# Patient Record
Sex: Male | Born: 1945
Health system: Southern US, Community
[De-identification: ages and names within clinical notes are randomized; demographics above are authoritative.]

## PROBLEM LIST (undated history)

## (undated) DIAGNOSIS — Z86718 Personal history of other venous thrombosis and embolism: Secondary | ICD-10-CM

## (undated) DIAGNOSIS — F329 Major depressive disorder, single episode, unspecified: Secondary | ICD-10-CM

## (undated) DIAGNOSIS — F32A Depression, unspecified: Secondary | ICD-10-CM

## (undated) HISTORY — PX: HERNIA REPAIR: SHX51

---
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
## 2015-05-10 DIAGNOSIS — F418 Other specified anxiety disorders: Secondary | ICD-10-CM | POA: Diagnosis not present

## 2015-05-10 DIAGNOSIS — I10 Essential (primary) hypertension: Secondary | ICD-10-CM | POA: Diagnosis not present

## 2015-05-23 DIAGNOSIS — I10 Essential (primary) hypertension: Secondary | ICD-10-CM | POA: Diagnosis not present

## 2015-06-07 DIAGNOSIS — I10 Essential (primary) hypertension: Secondary | ICD-10-CM | POA: Diagnosis not present

## 2015-06-07 DIAGNOSIS — R05 Cough: Secondary | ICD-10-CM | POA: Diagnosis not present

## 2015-07-18 DIAGNOSIS — H401131 Primary open-angle glaucoma, bilateral, mild stage: Secondary | ICD-10-CM | POA: Diagnosis not present

## 2015-07-18 DIAGNOSIS — H43813 Vitreous degeneration, bilateral: Secondary | ICD-10-CM | POA: Diagnosis not present

## 2016-04-04 DIAGNOSIS — H401131 Primary open-angle glaucoma, bilateral, mild stage: Secondary | ICD-10-CM | POA: Diagnosis not present

## 2016-05-21 DIAGNOSIS — I10 Essential (primary) hypertension: Secondary | ICD-10-CM | POA: Diagnosis not present

## 2016-05-21 DIAGNOSIS — R7301 Impaired fasting glucose: Secondary | ICD-10-CM | POA: Diagnosis not present

## 2016-05-21 DIAGNOSIS — E7800 Pure hypercholesterolemia, unspecified: Secondary | ICD-10-CM | POA: Diagnosis not present

## 2016-05-21 DIAGNOSIS — Z23 Encounter for immunization: Secondary | ICD-10-CM | POA: Diagnosis not present

## 2016-05-21 DIAGNOSIS — Z0000 Encounter for general adult medical examination without abnormal findings: Secondary | ICD-10-CM | POA: Diagnosis not present

## 2016-06-18 DIAGNOSIS — I10 Essential (primary) hypertension: Secondary | ICD-10-CM | POA: Diagnosis not present

## 2016-06-18 DIAGNOSIS — L918 Other hypertrophic disorders of the skin: Secondary | ICD-10-CM | POA: Diagnosis not present

## 2016-10-03 DIAGNOSIS — H35372 Puckering of macula, left eye: Secondary | ICD-10-CM | POA: Diagnosis not present

## 2016-10-03 DIAGNOSIS — H401131 Primary open-angle glaucoma, bilateral, mild stage: Secondary | ICD-10-CM | POA: Diagnosis not present

## 2016-10-07 DIAGNOSIS — W57XXXA Bitten or stung by nonvenomous insect and other nonvenomous arthropods, initial encounter: Secondary | ICD-10-CM | POA: Diagnosis not present

## 2016-10-07 DIAGNOSIS — S30861A Insect bite (nonvenomous) of abdominal wall, initial encounter: Secondary | ICD-10-CM | POA: Diagnosis not present

## 2016-11-18 DIAGNOSIS — I10 Essential (primary) hypertension: Secondary | ICD-10-CM | POA: Diagnosis not present

## 2016-11-19 DIAGNOSIS — E7800 Pure hypercholesterolemia, unspecified: Secondary | ICD-10-CM | POA: Diagnosis not present

## 2016-11-19 DIAGNOSIS — R7301 Impaired fasting glucose: Secondary | ICD-10-CM | POA: Diagnosis not present

## 2016-11-19 DIAGNOSIS — I10 Essential (primary) hypertension: Secondary | ICD-10-CM | POA: Diagnosis not present

## 2017-02-04 DIAGNOSIS — H401131 Primary open-angle glaucoma, bilateral, mild stage: Secondary | ICD-10-CM | POA: Diagnosis not present

## 2017-02-04 DIAGNOSIS — H35372 Puckering of macula, left eye: Secondary | ICD-10-CM | POA: Diagnosis not present

## 2017-03-11 DIAGNOSIS — H401131 Primary open-angle glaucoma, bilateral, mild stage: Secondary | ICD-10-CM | POA: Diagnosis not present

## 2017-03-27 DIAGNOSIS — S5011XA Contusion of right forearm, initial encounter: Secondary | ICD-10-CM | POA: Diagnosis not present

## 2017-04-29 DIAGNOSIS — F329 Major depressive disorder, single episode, unspecified: Secondary | ICD-10-CM | POA: Diagnosis not present

## 2017-04-30 DIAGNOSIS — F411 Generalized anxiety disorder: Secondary | ICD-10-CM | POA: Diagnosis not present

## 2017-04-30 DIAGNOSIS — F430 Acute stress reaction: Secondary | ICD-10-CM | POA: Diagnosis not present

## 2017-05-07 DIAGNOSIS — R413 Other amnesia: Secondary | ICD-10-CM | POA: Diagnosis not present

## 2017-05-09 DIAGNOSIS — G4700 Insomnia, unspecified: Secondary | ICD-10-CM | POA: Diagnosis not present

## 2017-05-09 DIAGNOSIS — F4321 Adjustment disorder with depressed mood: Secondary | ICD-10-CM | POA: Diagnosis not present

## 2017-05-12 DIAGNOSIS — Z9181 History of falling: Secondary | ICD-10-CM | POA: Diagnosis not present

## 2017-05-12 DIAGNOSIS — R4182 Altered mental status, unspecified: Secondary | ICD-10-CM | POA: Diagnosis not present

## 2017-05-12 DIAGNOSIS — N400 Enlarged prostate without lower urinary tract symptoms: Secondary | ICD-10-CM | POA: Diagnosis not present

## 2017-05-12 DIAGNOSIS — D181 Lymphangioma, any site: Secondary | ICD-10-CM | POA: Diagnosis not present

## 2017-05-12 DIAGNOSIS — Z7982 Long term (current) use of aspirin: Secondary | ICD-10-CM | POA: Diagnosis not present

## 2017-05-12 DIAGNOSIS — I10 Essential (primary) hypertension: Secondary | ICD-10-CM | POA: Diagnosis not present

## 2017-05-12 DIAGNOSIS — N390 Urinary tract infection, site not specified: Secondary | ICD-10-CM | POA: Diagnosis not present

## 2017-05-12 DIAGNOSIS — H409 Unspecified glaucoma: Secondary | ICD-10-CM | POA: Diagnosis not present

## 2017-05-12 DIAGNOSIS — F22 Delusional disorders: Secondary | ICD-10-CM | POA: Diagnosis not present

## 2017-05-12 DIAGNOSIS — G960 Cerebrospinal fluid leak: Secondary | ICD-10-CM | POA: Diagnosis not present

## 2017-05-12 DIAGNOSIS — Z79899 Other long term (current) drug therapy: Secondary | ICD-10-CM | POA: Diagnosis not present

## 2017-05-12 DIAGNOSIS — E785 Hyperlipidemia, unspecified: Secondary | ICD-10-CM | POA: Diagnosis not present

## 2017-05-13 DIAGNOSIS — Z9181 History of falling: Secondary | ICD-10-CM | POA: Diagnosis not present

## 2017-05-13 DIAGNOSIS — F22 Delusional disorders: Secondary | ICD-10-CM | POA: Diagnosis not present

## 2017-05-13 DIAGNOSIS — I10 Essential (primary) hypertension: Secondary | ICD-10-CM | POA: Diagnosis not present

## 2017-05-13 DIAGNOSIS — E785 Hyperlipidemia, unspecified: Secondary | ICD-10-CM | POA: Diagnosis not present

## 2017-05-13 DIAGNOSIS — R4182 Altered mental status, unspecified: Secondary | ICD-10-CM | POA: Diagnosis not present

## 2017-05-13 DIAGNOSIS — N390 Urinary tract infection, site not specified: Secondary | ICD-10-CM | POA: Diagnosis not present

## 2017-05-13 DIAGNOSIS — R410 Disorientation, unspecified: Secondary | ICD-10-CM | POA: Diagnosis not present

## 2017-05-13 DIAGNOSIS — G960 Cerebrospinal fluid leak: Secondary | ICD-10-CM | POA: Diagnosis not present

## 2017-05-13 DIAGNOSIS — N400 Enlarged prostate without lower urinary tract symptoms: Secondary | ICD-10-CM | POA: Diagnosis not present

## 2017-05-13 DIAGNOSIS — H409 Unspecified glaucoma: Secondary | ICD-10-CM | POA: Diagnosis not present

## 2017-05-14 ENCOUNTER — Other Ambulatory Visit: Payer: Self-pay

## 2017-05-14 DIAGNOSIS — N401 Enlarged prostate with lower urinary tract symptoms: Secondary | ICD-10-CM | POA: Diagnosis not present

## 2017-05-14 DIAGNOSIS — R351 Nocturia: Secondary | ICD-10-CM | POA: Diagnosis not present

## 2017-05-14 DIAGNOSIS — R339 Retention of urine, unspecified: Secondary | ICD-10-CM | POA: Diagnosis not present

## 2017-05-14 NOTE — Patient Outreach (Signed)
[facility]) Care Management  05/14/2017  Jon Walls 27-Aug-1945 [id number]   Telephone call to patient for transition of care follow up.  No answer.  HIPAA compliant voice message left.  Plan: RN CM will attempt patient again within 3 business days.  [name], RN, MSN [facility] Care Management Care Management Coordinator Direct Line [phone] Toll Free: [phone]  Fax: [phone]

## 2017-05-15 ENCOUNTER — Other Ambulatory Visit: Payer: Self-pay

## 2017-05-15 NOTE — Patient Outreach (Signed)
[facility]) Care Management  05/15/2017  Jon Walls [birth year] [id number]   Referral Date: 05/14/17 Referral Source: HTA report Date of Admission: 05/12/17 Diagnosis: Confusion Date of Discharge: 05/13/17 Facility: Learned: HTA  Outreach attempt # 2 Telephone call to patient.  Patient gives permission to speak to wife. HIPAA verified.  Wife reports that patient has had some confusion and they were told by the doctor to go to the emergency room.  Patient was found to have a subdural hematoma.  She states that they were told that it would hopefully dissolve on its own. She states patient had a slip on some ice about two months ago and that is where the hematoma came from.  She is hoping that patient gets better. She reports she sees some improvement but wanting total improvement.  Patient had some problems with urinary retention as well and saw the urologist on yesterday and was given Flomax and patient has had better urine stream.    Social: Patient loves in the home with his wife who is his primary support person.  She also has support from their son, patient's sister, and church friends.   Conditions: Patient diagnosed with subdural hematoma from a previous fall.  Patient also has some problems with enlarged prostate.    Medications: Wife reviewed medications and offers no concerns.    Appointments:  Patient will be following up with neurology in about 2 weeks.  Patient has transportation to appointments.    Consent: RN CM reviewed [facility] services with wife. Wife declined services as she has enough support right now.  Advised wife that CM would send letter and brochure for future reference .  She verbalized understanding and asked if she had question could she call.  Advised her she could call and if she felt services were needed she could also call.  She verbalized understanding.   Plan: RN CM will send letter and brochure for future reference.  RN  CM provided CM name and number along with 24 hour nurse line number. RN CM will close case and notify care management assistant of case status.    [name], RN, MSN [facility] Care Management Care Management Coordinator Direct Line [phone] Toll Free: [phone]  Fax: [phone]

## 2017-05-20 DIAGNOSIS — N401 Enlarged prostate with lower urinary tract symptoms: Secondary | ICD-10-CM | POA: Diagnosis not present

## 2017-05-20 DIAGNOSIS — N390 Urinary tract infection, site not specified: Secondary | ICD-10-CM | POA: Diagnosis not present

## 2017-05-20 DIAGNOSIS — R413 Other amnesia: Secondary | ICD-10-CM | POA: Diagnosis not present

## 2017-05-20 DIAGNOSIS — Z8679 Personal history of other diseases of the circulatory system: Secondary | ICD-10-CM | POA: Diagnosis not present

## 2017-05-23 ENCOUNTER — Other Ambulatory Visit: Payer: Self-pay | Admitting: Neurological Surgery

## 2017-05-23 DIAGNOSIS — G960 Cerebrospinal fluid leak: Principal | ICD-10-CM

## 2017-05-23 DIAGNOSIS — G9608 Other cranial cerebrospinal fluid leak: Secondary | ICD-10-CM

## 2017-05-26 ENCOUNTER — Ambulatory Visit
Admission: RE | Admit: 2017-05-26 | Discharge: 2017-05-26 | Disposition: A | Discharge status: Home / Self Care | Payer: PPO | Source: Ambulatory Visit | Attending: Neurological Surgery | Admitting: Neurological Surgery

## 2017-05-26 DIAGNOSIS — G9608 Other cranial cerebrospinal fluid leak: Secondary | ICD-10-CM

## 2017-05-26 DIAGNOSIS — R42 Dizziness and giddiness: Secondary | ICD-10-CM | POA: Diagnosis not present

## 2017-05-26 DIAGNOSIS — G960 Cerebrospinal fluid leak: Principal | ICD-10-CM

## 2017-05-28 DIAGNOSIS — G960 Cerebrospinal fluid leak: Secondary | ICD-10-CM | POA: Diagnosis not present

## 2017-05-28 DIAGNOSIS — F4321 Adjustment disorder with depressed mood: Secondary | ICD-10-CM | POA: Diagnosis not present

## 2017-06-11 DIAGNOSIS — G960 Cerebrospinal fluid leak: Secondary | ICD-10-CM | POA: Diagnosis not present

## 2017-06-12 DIAGNOSIS — S065X9A Traumatic subdural hemorrhage with loss of consciousness of unspecified duration, initial encounter: Secondary | ICD-10-CM | POA: Diagnosis not present

## 2017-06-12 DIAGNOSIS — I6200 Nontraumatic subdural hemorrhage, unspecified: Secondary | ICD-10-CM | POA: Diagnosis not present

## 2017-06-14 DIAGNOSIS — G939 Disorder of brain, unspecified: Secondary | ICD-10-CM | POA: Diagnosis not present

## 2017-06-14 DIAGNOSIS — S065X0A Traumatic subdural hemorrhage without loss of consciousness, initial encounter: Secondary | ICD-10-CM | POA: Diagnosis not present

## 2017-06-14 DIAGNOSIS — R930 Abnormal findings on diagnostic imaging of skull and head, not elsewhere classified: Secondary | ICD-10-CM | POA: Diagnosis not present

## 2017-06-14 DIAGNOSIS — S065X9S Traumatic subdural hemorrhage with loss of consciousness of unspecified duration, sequela: Secondary | ICD-10-CM | POA: Diagnosis not present

## 2017-06-14 DIAGNOSIS — I10 Essential (primary) hypertension: Secondary | ICD-10-CM | POA: Diagnosis not present

## 2017-06-14 DIAGNOSIS — E7800 Pure hypercholesterolemia, unspecified: Secondary | ICD-10-CM | POA: Diagnosis not present

## 2017-06-14 DIAGNOSIS — E7220 Disorder of urea cycle metabolism, unspecified: Secondary | ICD-10-CM | POA: Diagnosis not present

## 2017-06-14 DIAGNOSIS — D181 Lymphangioma, any site: Secondary | ICD-10-CM | POA: Diagnosis not present

## 2017-06-14 DIAGNOSIS — I6200 Nontraumatic subdural hemorrhage, unspecified: Secondary | ICD-10-CM | POA: Diagnosis not present

## 2017-06-14 DIAGNOSIS — G960 Cerebrospinal fluid leak: Secondary | ICD-10-CM | POA: Diagnosis not present

## 2017-06-14 DIAGNOSIS — G935 Compression of brain: Secondary | ICD-10-CM | POA: Diagnosis not present

## 2017-06-14 DIAGNOSIS — R4020 Unspecified coma: Secondary | ICD-10-CM | POA: Diagnosis not present

## 2017-06-14 DIAGNOSIS — R410 Disorientation, unspecified: Secondary | ICD-10-CM | POA: Diagnosis not present

## 2017-06-14 DIAGNOSIS — R42 Dizziness and giddiness: Secondary | ICD-10-CM | POA: Diagnosis not present

## 2017-06-14 DIAGNOSIS — F4489 Other dissociative and conversion disorders: Secondary | ICD-10-CM | POA: Diagnosis not present

## 2017-06-14 DIAGNOSIS — F0150 Vascular dementia without behavioral disturbance: Secondary | ICD-10-CM | POA: Diagnosis not present

## 2017-06-14 DIAGNOSIS — Z9181 History of falling: Secondary | ICD-10-CM | POA: Diagnosis not present

## 2017-06-14 DIAGNOSIS — S0990XA Unspecified injury of head, initial encounter: Secondary | ICD-10-CM | POA: Diagnosis not present

## 2017-06-14 DIAGNOSIS — I609 Nontraumatic subarachnoid hemorrhage, unspecified: Secondary | ICD-10-CM | POA: Diagnosis not present

## 2017-06-14 DIAGNOSIS — F064 Anxiety disorder due to known physiological condition: Secondary | ICD-10-CM | POA: Diagnosis not present

## 2017-06-14 DIAGNOSIS — N400 Enlarged prostate without lower urinary tract symptoms: Secondary | ICD-10-CM | POA: Diagnosis not present

## 2017-06-14 DIAGNOSIS — R413 Other amnesia: Secondary | ICD-10-CM | POA: Diagnosis not present

## 2017-06-14 DIAGNOSIS — F329 Major depressive disorder, single episode, unspecified: Secondary | ICD-10-CM | POA: Diagnosis not present

## 2017-06-14 DIAGNOSIS — F418 Other specified anxiety disorders: Secondary | ICD-10-CM | POA: Diagnosis not present

## 2017-06-14 DIAGNOSIS — E724 Disorders of ornithine metabolism: Secondary | ICD-10-CM | POA: Diagnosis not present

## 2017-06-14 DIAGNOSIS — F419 Anxiety disorder, unspecified: Secondary | ICD-10-CM | POA: Diagnosis not present

## 2017-06-14 DIAGNOSIS — R451 Restlessness and agitation: Secondary | ICD-10-CM | POA: Diagnosis not present

## 2017-06-14 DIAGNOSIS — Z7409 Other reduced mobility: Secondary | ICD-10-CM | POA: Diagnosis not present

## 2017-06-14 DIAGNOSIS — R4182 Altered mental status, unspecified: Secondary | ICD-10-CM | POA: Diagnosis not present

## 2017-06-14 DIAGNOSIS — F0631 Mood disorder due to known physiological condition with depressive features: Secondary | ICD-10-CM | POA: Diagnosis not present

## 2017-06-15 DIAGNOSIS — N400 Enlarged prostate without lower urinary tract symptoms: Secondary | ICD-10-CM | POA: Diagnosis not present

## 2017-06-15 DIAGNOSIS — R451 Restlessness and agitation: Secondary | ICD-10-CM | POA: Diagnosis not present

## 2017-06-15 DIAGNOSIS — R42 Dizziness and giddiness: Secondary | ICD-10-CM | POA: Diagnosis not present

## 2017-06-15 DIAGNOSIS — I6200 Nontraumatic subdural hemorrhage, unspecified: Secondary | ICD-10-CM | POA: Diagnosis not present

## 2017-06-15 DIAGNOSIS — E7220 Disorder of urea cycle metabolism, unspecified: Secondary | ICD-10-CM | POA: Diagnosis not present

## 2017-06-16 ENCOUNTER — Other Ambulatory Visit: Payer: Self-pay

## 2017-06-16 NOTE — Patient Outreach (Signed)
[facility]) Care Management  06/16/2017  Jon Walls [birth year] [id number]   Telephone call to patient for follow up nurse line call. Spoke with wife Jon Walls.  She is able to verify HIPAA.  She states that patient is currently at [facility] due to confusion.  She reports that patient ammonia levels are up. She states patient is on a locked unit due to confusion and trying leave the hospital.  Wife thankful for follow up call.  No concerns at this time.    Plan RN CM will close case and notify care management assistant of case status.   [name], RN, MSN [facility] Care Management Care Management Coordinator Direct Line [phone] Toll Free: [phone]  Fax: [phone]

## 2017-06-23 DIAGNOSIS — F418 Other specified anxiety disorders: Secondary | ICD-10-CM | POA: Diagnosis not present

## 2017-06-23 DIAGNOSIS — Z7902 Long term (current) use of antithrombotics/antiplatelets: Secondary | ICD-10-CM | POA: Diagnosis not present

## 2017-06-23 DIAGNOSIS — R001 Bradycardia, unspecified: Secondary | ICD-10-CM | POA: Diagnosis not present

## 2017-06-23 DIAGNOSIS — K5900 Constipation, unspecified: Secondary | ICD-10-CM | POA: Diagnosis not present

## 2017-06-23 DIAGNOSIS — N3001 Acute cystitis with hematuria: Secondary | ICD-10-CM | POA: Diagnosis not present

## 2017-06-23 DIAGNOSIS — Z9181 History of falling: Secondary | ICD-10-CM | POA: Diagnosis not present

## 2017-06-23 DIAGNOSIS — R441 Visual hallucinations: Secondary | ICD-10-CM | POA: Diagnosis not present

## 2017-06-23 DIAGNOSIS — D181 Lymphangioma, any site: Secondary | ICD-10-CM | POA: Diagnosis not present

## 2017-06-23 DIAGNOSIS — E7220 Disorder of urea cycle metabolism, unspecified: Secondary | ICD-10-CM | POA: Diagnosis not present

## 2017-06-23 DIAGNOSIS — Z7982 Long term (current) use of aspirin: Secondary | ICD-10-CM | POA: Diagnosis not present

## 2017-06-23 DIAGNOSIS — F419 Anxiety disorder, unspecified: Secondary | ICD-10-CM | POA: Diagnosis not present

## 2017-06-23 DIAGNOSIS — E7200 Disorders of amino-acid transport, unspecified: Secondary | ICD-10-CM | POA: Diagnosis not present

## 2017-06-23 DIAGNOSIS — B961 Klebsiella pneumoniae [K. pneumoniae] as the cause of diseases classified elsewhere: Secondary | ICD-10-CM | POA: Diagnosis not present

## 2017-06-23 DIAGNOSIS — B9620 Unspecified Escherichia coli [E. coli] as the cause of diseases classified elsewhere: Secondary | ICD-10-CM | POA: Diagnosis not present

## 2017-06-23 DIAGNOSIS — Z6822 Body mass index (BMI) 22.0-22.9, adult: Secondary | ICD-10-CM | POA: Diagnosis not present

## 2017-06-23 DIAGNOSIS — R413 Other amnesia: Secondary | ICD-10-CM | POA: Diagnosis not present

## 2017-06-23 DIAGNOSIS — I10 Essential (primary) hypertension: Secondary | ICD-10-CM | POA: Diagnosis not present

## 2017-06-23 DIAGNOSIS — E43 Unspecified severe protein-calorie malnutrition: Secondary | ICD-10-CM | POA: Diagnosis not present

## 2017-06-23 DIAGNOSIS — R630 Anorexia: Secondary | ICD-10-CM | POA: Diagnosis not present

## 2017-06-23 DIAGNOSIS — G960 Cerebrospinal fluid leak: Secondary | ICD-10-CM | POA: Diagnosis not present

## 2017-06-23 DIAGNOSIS — L89151 Pressure ulcer of sacral region, stage 1: Secondary | ICD-10-CM | POA: Diagnosis not present

## 2017-06-23 DIAGNOSIS — F0281 Dementia in other diseases classified elsewhere with behavioral disturbance: Secondary | ICD-10-CM | POA: Diagnosis not present

## 2017-06-23 DIAGNOSIS — F0151 Vascular dementia with behavioral disturbance: Secondary | ICD-10-CM | POA: Diagnosis not present

## 2017-06-23 DIAGNOSIS — S065X0D Traumatic subdural hemorrhage without loss of consciousness, subsequent encounter: Secondary | ICD-10-CM | POA: Diagnosis not present

## 2017-06-23 DIAGNOSIS — Z781 Physical restraint status: Secondary | ICD-10-CM | POA: Diagnosis not present

## 2017-06-23 DIAGNOSIS — E785 Hyperlipidemia, unspecified: Secondary | ICD-10-CM | POA: Diagnosis not present

## 2017-06-23 DIAGNOSIS — F329 Major depressive disorder, single episode, unspecified: Secondary | ICD-10-CM | POA: Diagnosis not present

## 2017-06-23 DIAGNOSIS — S065X9A Traumatic subdural hemorrhage with loss of consciousness of unspecified duration, initial encounter: Secondary | ICD-10-CM | POA: Diagnosis not present

## 2017-06-23 DIAGNOSIS — N400 Enlarged prostate without lower urinary tract symptoms: Secondary | ICD-10-CM | POA: Diagnosis not present

## 2017-06-23 DIAGNOSIS — Z1629 Resistance to other single specified antibiotic: Secondary | ICD-10-CM | POA: Diagnosis not present

## 2017-06-23 DIAGNOSIS — R0902 Hypoxemia: Secondary | ICD-10-CM | POA: Diagnosis not present

## 2017-06-23 DIAGNOSIS — R451 Restlessness and agitation: Secondary | ICD-10-CM | POA: Diagnosis not present

## 2017-06-23 DIAGNOSIS — R269 Unspecified abnormalities of gait and mobility: Secondary | ICD-10-CM | POA: Diagnosis not present

## 2017-06-23 DIAGNOSIS — R627 Adult failure to thrive: Secondary | ICD-10-CM | POA: Diagnosis not present

## 2017-06-23 DIAGNOSIS — S065X9S Traumatic subdural hemorrhage with loss of consciousness of unspecified duration, sequela: Secondary | ICD-10-CM | POA: Diagnosis not present

## 2017-06-23 DIAGNOSIS — Z9114 Patient's other noncompliance with medication regimen: Secondary | ICD-10-CM | POA: Diagnosis not present

## 2017-06-23 DIAGNOSIS — R4182 Altered mental status, unspecified: Secondary | ICD-10-CM | POA: Diagnosis not present

## 2017-06-23 DIAGNOSIS — Z7901 Long term (current) use of anticoagulants: Secondary | ICD-10-CM | POA: Diagnosis not present

## 2017-06-24 DIAGNOSIS — S065X9A Traumatic subdural hemorrhage with loss of consciousness of unspecified duration, initial encounter: Secondary | ICD-10-CM | POA: Diagnosis not present

## 2017-06-24 DIAGNOSIS — Z9114 Patient's other noncompliance with medication regimen: Secondary | ICD-10-CM | POA: Diagnosis not present

## 2017-06-24 DIAGNOSIS — E785 Hyperlipidemia, unspecified: Secondary | ICD-10-CM | POA: Diagnosis not present

## 2017-06-24 DIAGNOSIS — F0281 Dementia in other diseases classified elsewhere with behavioral disturbance: Secondary | ICD-10-CM | POA: Diagnosis not present

## 2017-06-25 DIAGNOSIS — R413 Other amnesia: Secondary | ICD-10-CM | POA: Diagnosis not present

## 2017-06-25 DIAGNOSIS — G960 Cerebrospinal fluid leak: Secondary | ICD-10-CM | POA: Diagnosis not present

## 2017-06-25 DIAGNOSIS — N3001 Acute cystitis with hematuria: Secondary | ICD-10-CM | POA: Diagnosis not present

## 2017-06-25 DIAGNOSIS — E7220 Disorder of urea cycle metabolism, unspecified: Secondary | ICD-10-CM | POA: Diagnosis not present

## 2017-07-11 DIAGNOSIS — E7220 Disorder of urea cycle metabolism, unspecified: Secondary | ICD-10-CM | POA: Diagnosis not present

## 2017-07-11 DIAGNOSIS — I2699 Other pulmonary embolism without acute cor pulmonale: Secondary | ICD-10-CM | POA: Diagnosis not present

## 2017-07-11 DIAGNOSIS — D181 Lymphangioma, any site: Secondary | ICD-10-CM | POA: Diagnosis not present

## 2017-07-11 DIAGNOSIS — M79605 Pain in left leg: Secondary | ICD-10-CM | POA: Diagnosis not present

## 2017-07-11 DIAGNOSIS — R945 Abnormal results of liver function studies: Secondary | ICD-10-CM | POA: Diagnosis not present

## 2017-07-11 DIAGNOSIS — F419 Anxiety disorder, unspecified: Secondary | ICD-10-CM | POA: Diagnosis not present

## 2017-07-11 DIAGNOSIS — R001 Bradycardia, unspecified: Secondary | ICD-10-CM | POA: Diagnosis not present

## 2017-07-11 DIAGNOSIS — K7689 Other specified diseases of liver: Secondary | ICD-10-CM | POA: Diagnosis not present

## 2017-07-11 DIAGNOSIS — E7800 Pure hypercholesterolemia, unspecified: Secondary | ICD-10-CM | POA: Diagnosis not present

## 2017-07-11 DIAGNOSIS — Z79899 Other long term (current) drug therapy: Secondary | ICD-10-CM | POA: Diagnosis not present

## 2017-07-11 DIAGNOSIS — I82412 Acute embolism and thrombosis of left femoral vein: Secondary | ICD-10-CM | POA: Diagnosis not present

## 2017-07-11 DIAGNOSIS — F329 Major depressive disorder, single episode, unspecified: Secondary | ICD-10-CM | POA: Diagnosis not present

## 2017-07-11 DIAGNOSIS — I82401 Acute embolism and thrombosis of unspecified deep veins of right lower extremity: Secondary | ICD-10-CM | POA: Diagnosis not present

## 2017-07-11 DIAGNOSIS — N400 Enlarged prostate without lower urinary tract symptoms: Secondary | ICD-10-CM | POA: Diagnosis not present

## 2017-07-11 DIAGNOSIS — Z01818 Encounter for other preprocedural examination: Secondary | ICD-10-CM | POA: Diagnosis not present

## 2017-07-11 DIAGNOSIS — E785 Hyperlipidemia, unspecified: Secondary | ICD-10-CM | POA: Diagnosis not present

## 2017-07-11 DIAGNOSIS — E724 Disorders of ornithine metabolism: Secondary | ICD-10-CM | POA: Diagnosis not present

## 2017-07-11 DIAGNOSIS — I499 Cardiac arrhythmia, unspecified: Secondary | ICD-10-CM | POA: Diagnosis not present

## 2017-07-11 DIAGNOSIS — K760 Fatty (change of) liver, not elsewhere classified: Secondary | ICD-10-CM | POA: Diagnosis not present

## 2017-07-11 DIAGNOSIS — Z9181 History of falling: Secondary | ICD-10-CM | POA: Diagnosis not present

## 2017-07-11 DIAGNOSIS — I82432 Acute embolism and thrombosis of left popliteal vein: Secondary | ICD-10-CM | POA: Diagnosis not present

## 2017-07-11 DIAGNOSIS — G960 Cerebrospinal fluid leak: Secondary | ICD-10-CM | POA: Diagnosis not present

## 2017-07-11 DIAGNOSIS — I82409 Acute embolism and thrombosis of unspecified deep veins of unspecified lower extremity: Secondary | ICD-10-CM | POA: Diagnosis not present

## 2017-07-11 DIAGNOSIS — I10 Essential (primary) hypertension: Secondary | ICD-10-CM | POA: Diagnosis not present

## 2017-07-11 DIAGNOSIS — Z7982 Long term (current) use of aspirin: Secondary | ICD-10-CM | POA: Diagnosis not present

## 2017-07-11 DIAGNOSIS — L89151 Pressure ulcer of sacral region, stage 1: Secondary | ICD-10-CM | POA: Diagnosis not present

## 2017-07-11 DIAGNOSIS — G2581 Restless legs syndrome: Secondary | ICD-10-CM | POA: Diagnosis not present

## 2017-07-11 DIAGNOSIS — I82402 Acute embolism and thrombosis of unspecified deep veins of left lower extremity: Secondary | ICD-10-CM | POA: Diagnosis not present

## 2017-07-21 DIAGNOSIS — G960 Cerebrospinal fluid leak: Secondary | ICD-10-CM | POA: Diagnosis not present

## 2017-07-21 DIAGNOSIS — I82432 Acute embolism and thrombosis of left popliteal vein: Secondary | ICD-10-CM | POA: Diagnosis not present

## 2017-07-21 DIAGNOSIS — I82499 Acute embolism and thrombosis of other specified deep vein of unspecified lower extremity: Secondary | ICD-10-CM | POA: Diagnosis not present

## 2017-07-21 DIAGNOSIS — I2699 Other pulmonary embolism without acute cor pulmonale: Secondary | ICD-10-CM | POA: Diagnosis not present

## 2017-07-21 DIAGNOSIS — I10 Essential (primary) hypertension: Secondary | ICD-10-CM | POA: Diagnosis not present

## 2017-07-21 DIAGNOSIS — Z5181 Encounter for therapeutic drug level monitoring: Secondary | ICD-10-CM | POA: Diagnosis not present

## 2017-07-21 DIAGNOSIS — Z7901 Long term (current) use of anticoagulants: Secondary | ICD-10-CM | POA: Diagnosis not present

## 2017-07-23 ENCOUNTER — Other Ambulatory Visit: Payer: Self-pay

## 2017-07-23 DIAGNOSIS — F29 Unspecified psychosis not due to a substance or known physiological condition: Secondary | ICD-10-CM | POA: Diagnosis not present

## 2017-07-23 NOTE — Patient Outreach (Signed)
[facility]) Care Management  07/23/2017  Jon Walls 1945/09/09 [id number]   Referral received. No outreach warranted at this time. Transition of Care  will be completed by primary care provider office who will refer to [facility] care management if needed.  Plan: RN CM will close case.  [name], RN, MSN [facility] Care Management Care Management Coordinator Direct Line [phone] Toll Free: [phone]  Fax: [phone]

## 2017-07-25 DIAGNOSIS — R791 Abnormal coagulation profile: Secondary | ICD-10-CM | POA: Diagnosis not present

## 2017-07-25 DIAGNOSIS — Z5181 Encounter for therapeutic drug level monitoring: Secondary | ICD-10-CM | POA: Diagnosis not present

## 2017-07-25 DIAGNOSIS — I82499 Acute embolism and thrombosis of other specified deep vein of unspecified lower extremity: Secondary | ICD-10-CM | POA: Diagnosis not present

## 2017-07-25 DIAGNOSIS — I2699 Other pulmonary embolism without acute cor pulmonale: Secondary | ICD-10-CM | POA: Diagnosis not present

## 2017-07-30 DIAGNOSIS — R600 Localized edema: Secondary | ICD-10-CM | POA: Diagnosis not present

## 2017-07-30 DIAGNOSIS — Z86711 Personal history of pulmonary embolism: Secondary | ICD-10-CM | POA: Diagnosis not present

## 2017-07-30 DIAGNOSIS — Z86718 Personal history of other venous thrombosis and embolism: Secondary | ICD-10-CM | POA: Diagnosis not present

## 2017-07-31 DIAGNOSIS — I2699 Other pulmonary embolism without acute cor pulmonale: Secondary | ICD-10-CM | POA: Diagnosis not present

## 2017-07-31 DIAGNOSIS — Z7901 Long term (current) use of anticoagulants: Secondary | ICD-10-CM | POA: Diagnosis not present

## 2017-07-31 DIAGNOSIS — F4321 Adjustment disorder with depressed mood: Secondary | ICD-10-CM | POA: Diagnosis not present

## 2017-07-31 DIAGNOSIS — F419 Anxiety disorder, unspecified: Secondary | ICD-10-CM | POA: Diagnosis not present

## 2017-07-31 DIAGNOSIS — R443 Hallucinations, unspecified: Secondary | ICD-10-CM | POA: Diagnosis not present

## 2017-07-31 DIAGNOSIS — I82432 Acute embolism and thrombosis of left popliteal vein: Secondary | ICD-10-CM | POA: Diagnosis not present

## 2017-07-31 DIAGNOSIS — Z9181 History of falling: Secondary | ICD-10-CM | POA: Diagnosis not present

## 2017-07-31 DIAGNOSIS — G960 Cerebrospinal fluid leak: Secondary | ICD-10-CM | POA: Diagnosis not present

## 2017-08-01 DIAGNOSIS — R791 Abnormal coagulation profile: Secondary | ICD-10-CM | POA: Diagnosis not present

## 2017-08-01 DIAGNOSIS — Z7901 Long term (current) use of anticoagulants: Secondary | ICD-10-CM | POA: Diagnosis not present

## 2017-08-01 DIAGNOSIS — I2699 Other pulmonary embolism without acute cor pulmonale: Secondary | ICD-10-CM | POA: Diagnosis not present

## 2017-08-01 DIAGNOSIS — Z5181 Encounter for therapeutic drug level monitoring: Secondary | ICD-10-CM | POA: Diagnosis not present

## 2017-08-01 DIAGNOSIS — I82499 Acute embolism and thrombosis of other specified deep vein of unspecified lower extremity: Secondary | ICD-10-CM | POA: Diagnosis not present

## 2017-08-04 DIAGNOSIS — R600 Localized edema: Secondary | ICD-10-CM | POA: Diagnosis not present

## 2017-08-04 DIAGNOSIS — I82412 Acute embolism and thrombosis of left femoral vein: Secondary | ICD-10-CM | POA: Diagnosis not present

## 2017-08-12 DIAGNOSIS — F29 Unspecified psychosis not due to a substance or known physiological condition: Secondary | ICD-10-CM | POA: Diagnosis not present

## 2017-08-14 DIAGNOSIS — R7301 Impaired fasting glucose: Secondary | ICD-10-CM | POA: Diagnosis not present

## 2017-08-14 DIAGNOSIS — F4321 Adjustment disorder with depressed mood: Secondary | ICD-10-CM | POA: Diagnosis not present

## 2017-08-14 DIAGNOSIS — E7800 Pure hypercholesterolemia, unspecified: Secondary | ICD-10-CM | POA: Diagnosis not present

## 2017-08-14 DIAGNOSIS — I10 Essential (primary) hypertension: Secondary | ICD-10-CM | POA: Diagnosis not present

## 2017-08-14 DIAGNOSIS — Z0000 Encounter for general adult medical examination without abnormal findings: Secondary | ICD-10-CM | POA: Diagnosis not present

## 2017-08-14 DIAGNOSIS — N400 Enlarged prostate without lower urinary tract symptoms: Secondary | ICD-10-CM | POA: Diagnosis not present

## 2017-09-10 DIAGNOSIS — F29 Unspecified psychosis not due to a substance or known physiological condition: Secondary | ICD-10-CM | POA: Diagnosis not present

## 2017-09-18 ENCOUNTER — Other Ambulatory Visit: Payer: Self-pay | Admitting: *Deleted

## 2017-09-18 NOTE — Patient Outreach (Signed)
High Risk HTA Patient screening attempted. No answer, however, I was able to leave a message and requested a return call.  Jon Walls. Myrtie Neither, MSN, [facility] Gerontological Nurse Practitioner [facility] Care Management [phone]

## 2017-09-19 DIAGNOSIS — I10 Essential (primary) hypertension: Secondary | ICD-10-CM | POA: Diagnosis not present

## 2017-09-22 DIAGNOSIS — H401131 Primary open-angle glaucoma, bilateral, mild stage: Secondary | ICD-10-CM | POA: Diagnosis not present

## 2017-09-22 DIAGNOSIS — H02839 Dermatochalasis of unspecified eye, unspecified eyelid: Secondary | ICD-10-CM | POA: Diagnosis not present

## 2017-09-29 DIAGNOSIS — I2699 Other pulmonary embolism without acute cor pulmonale: Secondary | ICD-10-CM | POA: Diagnosis not present

## 2017-09-29 DIAGNOSIS — I82499 Acute embolism and thrombosis of other specified deep vein of unspecified lower extremity: Secondary | ICD-10-CM | POA: Diagnosis not present

## 2017-09-29 DIAGNOSIS — I82402 Acute embolism and thrombosis of unspecified deep veins of left lower extremity: Secondary | ICD-10-CM | POA: Diagnosis not present

## 2017-09-29 DIAGNOSIS — Z7901 Long term (current) use of anticoagulants: Secondary | ICD-10-CM | POA: Diagnosis not present

## 2017-10-01 ENCOUNTER — Other Ambulatory Visit: Payer: Self-pay | Admitting: *Deleted

## 2017-10-01 ENCOUNTER — Encounter: Payer: Self-pay | Admitting: *Deleted

## 2017-10-01 NOTE — Patient Outreach (Signed)
Second attempt to do a telephone screen in response to a referral from HTA. I again did not reach Mr. Harb, but did leave a message and requested a return call. I will send a letter and call him again in 10 business days.  Eulah Pont. Myrtie Neither, MSN, [facility] Gerontological Nurse Practitioner [facility] Care Management [phone]

## 2017-10-15 ENCOUNTER — Other Ambulatory Visit: Payer: Self-pay | Admitting: *Deleted

## 2017-10-15 NOTE — Patient Outreach (Signed)
Final attempt to engage pt in [facility] services. Called and Mrs. Roa answered the phone. She states they are in the MD office currently and they have received my messages and letter. She states she will call me back later.  Eulah Pont. Myrtie Neither, MSN, [facility] Gerontological Nurse Practitioner [facility] Care Management [phone]

## 2017-10-17 NOTE — Patient Outreach (Signed)
Attempted to reach pt again and was able to leave a voice mail requesting a return call.  Eulah Pont. Myrtie Neither, MSN, [facility] Gerontological Nurse Practitioner [facility] Care Management [phone]

## 2017-10-22 DIAGNOSIS — H5203 Hypermetropia, bilateral: Secondary | ICD-10-CM | POA: Diagnosis not present

## 2017-10-22 DIAGNOSIS — H401131 Primary open-angle glaucoma, bilateral, mild stage: Secondary | ICD-10-CM | POA: Diagnosis not present

## 2017-10-22 DIAGNOSIS — H35372 Puckering of macula, left eye: Secondary | ICD-10-CM | POA: Diagnosis not present

## 2017-10-30 DIAGNOSIS — N401 Enlarged prostate with lower urinary tract symptoms: Secondary | ICD-10-CM | POA: Diagnosis not present

## 2017-10-30 DIAGNOSIS — Z125 Encounter for screening for malignant neoplasm of prostate: Secondary | ICD-10-CM | POA: Diagnosis not present

## 2017-11-25 DIAGNOSIS — K5900 Constipation, unspecified: Secondary | ICD-10-CM | POA: Diagnosis not present

## 2017-11-25 DIAGNOSIS — E7220 Disorder of urea cycle metabolism, unspecified: Secondary | ICD-10-CM | POA: Diagnosis not present

## 2017-11-25 DIAGNOSIS — F331 Major depressive disorder, recurrent, moderate: Secondary | ICD-10-CM | POA: Diagnosis not present

## 2017-11-26 DIAGNOSIS — K5901 Slow transit constipation: Secondary | ICD-10-CM | POA: Diagnosis not present

## 2017-11-26 DIAGNOSIS — N201 Calculus of ureter: Secondary | ICD-10-CM | POA: Diagnosis not present

## 2017-11-26 DIAGNOSIS — I517 Cardiomegaly: Secondary | ICD-10-CM | POA: Diagnosis not present

## 2017-11-26 DIAGNOSIS — N132 Hydronephrosis with renal and ureteral calculous obstruction: Secondary | ICD-10-CM | POA: Diagnosis not present

## 2017-11-26 DIAGNOSIS — N401 Enlarged prostate with lower urinary tract symptoms: Secondary | ICD-10-CM | POA: Diagnosis not present

## 2017-11-26 DIAGNOSIS — R338 Other retention of urine: Secondary | ICD-10-CM | POA: Diagnosis not present

## 2017-11-26 DIAGNOSIS — R339 Retention of urine, unspecified: Secondary | ICD-10-CM | POA: Diagnosis not present

## 2017-11-27 DIAGNOSIS — N201 Calculus of ureter: Secondary | ICD-10-CM | POA: Diagnosis not present

## 2017-11-27 DIAGNOSIS — N3090 Cystitis, unspecified without hematuria: Secondary | ICD-10-CM | POA: Diagnosis not present

## 2017-11-27 DIAGNOSIS — R8290 Unspecified abnormal findings in urine: Secondary | ICD-10-CM | POA: Diagnosis not present

## 2017-11-28 DIAGNOSIS — N320 Bladder-neck obstruction: Secondary | ICD-10-CM | POA: Diagnosis not present

## 2017-11-28 DIAGNOSIS — Z466 Encounter for fitting and adjustment of urinary device: Secondary | ICD-10-CM | POA: Diagnosis not present

## 2017-11-28 DIAGNOSIS — Z7401 Bed confinement status: Secondary | ICD-10-CM | POA: Diagnosis not present

## 2017-11-28 DIAGNOSIS — Z5320 Procedure and treatment not carried out because of patient's decision for unspecified reasons: Secondary | ICD-10-CM | POA: Diagnosis not present

## 2017-11-28 DIAGNOSIS — R4182 Altered mental status, unspecified: Secondary | ICD-10-CM | POA: Diagnosis not present

## 2017-11-28 DIAGNOSIS — N471 Phimosis: Secondary | ICD-10-CM | POA: Diagnosis not present

## 2017-11-28 DIAGNOSIS — R338 Other retention of urine: Secondary | ICD-10-CM | POA: Diagnosis not present

## 2017-11-28 DIAGNOSIS — R630 Anorexia: Secondary | ICD-10-CM | POA: Diagnosis not present

## 2017-11-28 DIAGNOSIS — F4321 Adjustment disorder with depressed mood: Secondary | ICD-10-CM | POA: Diagnosis not present

## 2017-11-28 DIAGNOSIS — Z7901 Long term (current) use of anticoagulants: Secondary | ICD-10-CM | POA: Diagnosis not present

## 2017-11-28 DIAGNOSIS — B9620 Unspecified Escherichia coli [E. coli] as the cause of diseases classified elsewhere: Secondary | ICD-10-CM | POA: Diagnosis not present

## 2017-11-28 DIAGNOSIS — R627 Adult failure to thrive: Secondary | ICD-10-CM | POA: Diagnosis not present

## 2017-11-28 DIAGNOSIS — N390 Urinary tract infection, site not specified: Secondary | ICD-10-CM | POA: Diagnosis not present

## 2017-11-28 DIAGNOSIS — I82499 Acute embolism and thrombosis of other specified deep vein of unspecified lower extremity: Secondary | ICD-10-CM | POA: Diagnosis not present

## 2017-11-28 DIAGNOSIS — E7800 Pure hypercholesterolemia, unspecified: Secondary | ICD-10-CM | POA: Diagnosis not present

## 2017-11-28 DIAGNOSIS — N3000 Acute cystitis without hematuria: Secondary | ICD-10-CM | POA: Diagnosis not present

## 2017-11-28 DIAGNOSIS — F0390 Unspecified dementia without behavioral disturbance: Secondary | ICD-10-CM | POA: Diagnosis not present

## 2017-11-28 DIAGNOSIS — E039 Hypothyroidism, unspecified: Secondary | ICD-10-CM | POA: Diagnosis not present

## 2017-11-28 DIAGNOSIS — F331 Major depressive disorder, recurrent, moderate: Secondary | ICD-10-CM | POA: Diagnosis not present

## 2017-11-28 DIAGNOSIS — Z6822 Body mass index (BMI) 22.0-22.9, adult: Secondary | ICD-10-CM | POA: Diagnosis not present

## 2017-11-28 DIAGNOSIS — E249 Cushing's syndrome, unspecified: Secondary | ICD-10-CM | POA: Diagnosis not present

## 2017-11-28 DIAGNOSIS — N138 Other obstructive and reflux uropathy: Secondary | ICD-10-CM | POA: Diagnosis not present

## 2017-11-28 DIAGNOSIS — I6200 Nontraumatic subdural hemorrhage, unspecified: Secondary | ICD-10-CM | POA: Diagnosis not present

## 2017-11-28 DIAGNOSIS — R509 Fever, unspecified: Secondary | ICD-10-CM | POA: Diagnosis not present

## 2017-11-28 DIAGNOSIS — R453 Demoralization and apathy: Secondary | ICD-10-CM | POA: Diagnosis not present

## 2017-11-28 DIAGNOSIS — R410 Disorientation, unspecified: Secondary | ICD-10-CM | POA: Diagnosis not present

## 2017-11-28 DIAGNOSIS — I10 Essential (primary) hypertension: Secondary | ICD-10-CM | POA: Diagnosis not present

## 2017-11-28 DIAGNOSIS — I2699 Other pulmonary embolism without acute cor pulmonale: Secondary | ICD-10-CM | POA: Diagnosis not present

## 2017-11-28 DIAGNOSIS — N401 Enlarged prostate with lower urinary tract symptoms: Secondary | ICD-10-CM | POA: Diagnosis not present

## 2017-11-28 DIAGNOSIS — I82409 Acute embolism and thrombosis of unspecified deep veins of unspecified lower extremity: Secondary | ICD-10-CM | POA: Diagnosis not present

## 2017-11-28 DIAGNOSIS — T191XXA Foreign body in bladder, initial encounter: Secondary | ICD-10-CM | POA: Diagnosis not present

## 2017-11-28 DIAGNOSIS — Z87440 Personal history of urinary (tract) infections: Secondary | ICD-10-CM | POA: Diagnosis not present

## 2017-11-28 DIAGNOSIS — K5900 Constipation, unspecified: Secondary | ICD-10-CM | POA: Diagnosis not present

## 2017-11-28 DIAGNOSIS — F4320 Adjustment disorder, unspecified: Secondary | ICD-10-CM | POA: Diagnosis not present

## 2017-11-28 DIAGNOSIS — F323 Major depressive disorder, single episode, severe with psychotic features: Secondary | ICD-10-CM | POA: Diagnosis not present

## 2017-11-28 DIAGNOSIS — I951 Orthostatic hypotension: Secondary | ICD-10-CM | POA: Diagnosis not present

## 2017-11-28 DIAGNOSIS — Z86711 Personal history of pulmonary embolism: Secondary | ICD-10-CM | POA: Diagnosis not present

## 2017-11-28 DIAGNOSIS — E7220 Disorder of urea cycle metabolism, unspecified: Secondary | ICD-10-CM | POA: Diagnosis not present

## 2017-11-28 DIAGNOSIS — N201 Calculus of ureter: Secondary | ICD-10-CM | POA: Diagnosis not present

## 2017-11-28 DIAGNOSIS — E876 Hypokalemia: Secondary | ICD-10-CM | POA: Diagnosis not present

## 2017-11-28 DIAGNOSIS — R5381 Other malaise: Secondary | ICD-10-CM | POA: Diagnosis not present

## 2017-11-28 DIAGNOSIS — H409 Unspecified glaucoma: Secondary | ICD-10-CM | POA: Diagnosis not present

## 2017-11-28 DIAGNOSIS — R109 Unspecified abdominal pain: Secondary | ICD-10-CM | POA: Diagnosis not present

## 2017-11-28 DIAGNOSIS — N132 Hydronephrosis with renal and ureteral calculous obstruction: Secondary | ICD-10-CM | POA: Diagnosis not present

## 2017-11-28 DIAGNOSIS — F329 Major depressive disorder, single episode, unspecified: Secondary | ICD-10-CM | POA: Diagnosis not present

## 2017-11-28 DIAGNOSIS — E0781 Sick-euthyroid syndrome: Secondary | ICD-10-CM | POA: Diagnosis not present

## 2017-11-28 DIAGNOSIS — I959 Hypotension, unspecified: Secondary | ICD-10-CM | POA: Diagnosis not present

## 2017-11-28 DIAGNOSIS — N400 Enlarged prostate without lower urinary tract symptoms: Secondary | ICD-10-CM | POA: Diagnosis not present

## 2017-11-28 DIAGNOSIS — Z960 Presence of urogenital implants: Secondary | ICD-10-CM | POA: Diagnosis not present

## 2017-11-28 DIAGNOSIS — N23 Unspecified renal colic: Secondary | ICD-10-CM | POA: Diagnosis not present

## 2017-11-28 DIAGNOSIS — N472 Paraphimosis: Secondary | ICD-10-CM | POA: Diagnosis not present

## 2017-11-28 DIAGNOSIS — Z86718 Personal history of other venous thrombosis and embolism: Secondary | ICD-10-CM | POA: Diagnosis not present

## 2017-11-28 DIAGNOSIS — E785 Hyperlipidemia, unspecified: Secondary | ICD-10-CM | POA: Diagnosis not present

## 2017-11-28 DIAGNOSIS — M2550 Pain in unspecified joint: Secondary | ICD-10-CM | POA: Diagnosis not present

## 2017-11-28 DIAGNOSIS — F0150 Vascular dementia without behavioral disturbance: Secondary | ICD-10-CM | POA: Diagnosis not present

## 2017-11-28 DIAGNOSIS — N200 Calculus of kidney: Secondary | ICD-10-CM | POA: Diagnosis not present

## 2017-11-28 DIAGNOSIS — Z87442 Personal history of urinary calculi: Secondary | ICD-10-CM | POA: Diagnosis not present

## 2017-11-28 DIAGNOSIS — N136 Pyonephrosis: Secondary | ICD-10-CM | POA: Diagnosis not present

## 2017-11-28 DIAGNOSIS — R079 Chest pain, unspecified: Secondary | ICD-10-CM | POA: Diagnosis not present

## 2017-11-28 DIAGNOSIS — Z6823 Body mass index (BMI) 23.0-23.9, adult: Secondary | ICD-10-CM | POA: Diagnosis not present

## 2017-11-28 DIAGNOSIS — Z515 Encounter for palliative care: Secondary | ICD-10-CM | POA: Diagnosis not present

## 2017-11-28 DIAGNOSIS — N3090 Cystitis, unspecified without hematuria: Secondary | ICD-10-CM | POA: Diagnosis not present

## 2017-11-28 DIAGNOSIS — F29 Unspecified psychosis not due to a substance or known physiological condition: Secondary | ICD-10-CM | POA: Diagnosis not present

## 2017-11-29 DIAGNOSIS — I82499 Acute embolism and thrombosis of other specified deep vein of unspecified lower extremity: Secondary | ICD-10-CM | POA: Diagnosis not present

## 2017-11-29 DIAGNOSIS — E7220 Disorder of urea cycle metabolism, unspecified: Secondary | ICD-10-CM | POA: Diagnosis not present

## 2017-11-29 DIAGNOSIS — N200 Calculus of kidney: Secondary | ICD-10-CM | POA: Diagnosis not present

## 2017-11-29 DIAGNOSIS — N201 Calculus of ureter: Secondary | ICD-10-CM | POA: Diagnosis not present

## 2017-11-29 DIAGNOSIS — I10 Essential (primary) hypertension: Secondary | ICD-10-CM | POA: Diagnosis not present

## 2017-11-29 DIAGNOSIS — E7800 Pure hypercholesterolemia, unspecified: Secondary | ICD-10-CM | POA: Diagnosis not present

## 2017-11-29 DIAGNOSIS — F4321 Adjustment disorder with depressed mood: Secondary | ICD-10-CM | POA: Diagnosis not present

## 2017-11-29 DIAGNOSIS — N3090 Cystitis, unspecified without hematuria: Secondary | ICD-10-CM | POA: Diagnosis not present

## 2017-11-29 DIAGNOSIS — N400 Enlarged prostate without lower urinary tract symptoms: Secondary | ICD-10-CM | POA: Diagnosis not present

## 2017-12-05 DIAGNOSIS — N471 Phimosis: Secondary | ICD-10-CM | POA: Diagnosis not present

## 2017-12-05 DIAGNOSIS — N201 Calculus of ureter: Secondary | ICD-10-CM | POA: Diagnosis not present

## 2018-01-06 DIAGNOSIS — F329 Major depressive disorder, single episode, unspecified: Secondary | ICD-10-CM | POA: Diagnosis not present

## 2018-01-06 DIAGNOSIS — B9620 Unspecified Escherichia coli [E. coli] as the cause of diseases classified elsewhere: Secondary | ICD-10-CM | POA: Diagnosis not present

## 2018-01-06 DIAGNOSIS — I82409 Acute embolism and thrombosis of unspecified deep veins of unspecified lower extremity: Secondary | ICD-10-CM | POA: Diagnosis not present

## 2018-01-06 DIAGNOSIS — F0151 Vascular dementia with behavioral disturbance: Secondary | ICD-10-CM | POA: Diagnosis not present

## 2018-01-06 DIAGNOSIS — E7220 Disorder of urea cycle metabolism, unspecified: Secondary | ICD-10-CM | POA: Diagnosis not present

## 2018-01-06 DIAGNOSIS — R5381 Other malaise: Secondary | ICD-10-CM | POA: Diagnosis not present

## 2018-01-06 DIAGNOSIS — Z960 Presence of urogenital implants: Secondary | ICD-10-CM | POA: Diagnosis not present

## 2018-01-06 DIAGNOSIS — R630 Anorexia: Secondary | ICD-10-CM | POA: Diagnosis not present

## 2018-01-06 DIAGNOSIS — E785 Hyperlipidemia, unspecified: Secondary | ICD-10-CM | POA: Diagnosis not present

## 2018-01-06 DIAGNOSIS — N472 Paraphimosis: Secondary | ICD-10-CM | POA: Diagnosis not present

## 2018-01-06 DIAGNOSIS — N200 Calculus of kidney: Secondary | ICD-10-CM | POA: Diagnosis not present

## 2018-01-06 DIAGNOSIS — F333 Major depressive disorder, recurrent, severe with psychotic symptoms: Secondary | ICD-10-CM | POA: Diagnosis not present

## 2018-01-06 DIAGNOSIS — Z7401 Bed confinement status: Secondary | ICD-10-CM | POA: Diagnosis not present

## 2018-01-06 DIAGNOSIS — N320 Bladder-neck obstruction: Secondary | ICD-10-CM | POA: Diagnosis not present

## 2018-01-06 DIAGNOSIS — N201 Calculus of ureter: Secondary | ICD-10-CM | POA: Diagnosis not present

## 2018-01-06 DIAGNOSIS — I959 Hypotension, unspecified: Secondary | ICD-10-CM | POA: Diagnosis not present

## 2018-01-06 DIAGNOSIS — K5900 Constipation, unspecified: Secondary | ICD-10-CM | POA: Diagnosis not present

## 2018-01-06 DIAGNOSIS — N390 Urinary tract infection, site not specified: Secondary | ICD-10-CM | POA: Diagnosis not present

## 2018-01-06 DIAGNOSIS — I10 Essential (primary) hypertension: Secondary | ICD-10-CM | POA: Diagnosis not present

## 2018-01-06 DIAGNOSIS — R7611 Nonspecific reaction to tuberculin skin test without active tuberculosis: Secondary | ICD-10-CM | POA: Diagnosis not present

## 2018-01-06 DIAGNOSIS — N3090 Cystitis, unspecified without hematuria: Secondary | ICD-10-CM | POA: Diagnosis not present

## 2018-01-06 DIAGNOSIS — N401 Enlarged prostate with lower urinary tract symptoms: Secondary | ICD-10-CM | POA: Diagnosis not present

## 2018-01-06 DIAGNOSIS — M2550 Pain in unspecified joint: Secondary | ICD-10-CM | POA: Diagnosis not present

## 2018-01-06 DIAGNOSIS — I6200 Nontraumatic subdural hemorrhage, unspecified: Secondary | ICD-10-CM | POA: Diagnosis not present

## 2018-01-06 DIAGNOSIS — F0150 Vascular dementia without behavioral disturbance: Secondary | ICD-10-CM | POA: Diagnosis not present

## 2018-01-06 DIAGNOSIS — R627 Adult failure to thrive: Secondary | ICD-10-CM | POA: Diagnosis not present

## 2018-01-06 DIAGNOSIS — Z7901 Long term (current) use of anticoagulants: Secondary | ICD-10-CM | POA: Diagnosis not present

## 2018-01-06 DIAGNOSIS — H409 Unspecified glaucoma: Secondary | ICD-10-CM | POA: Diagnosis not present

## 2018-01-06 DIAGNOSIS — Z6822 Body mass index (BMI) 22.0-22.9, adult: Secondary | ICD-10-CM | POA: Diagnosis not present

## 2018-01-06 DIAGNOSIS — Z466 Encounter for fitting and adjustment of urinary device: Secondary | ICD-10-CM | POA: Diagnosis not present

## 2018-01-06 DIAGNOSIS — E876 Hypokalemia: Secondary | ICD-10-CM | POA: Diagnosis not present

## 2018-01-06 DIAGNOSIS — I2699 Other pulmonary embolism without acute cor pulmonale: Secondary | ICD-10-CM | POA: Diagnosis not present

## 2018-01-06 DIAGNOSIS — E249 Cushing's syndrome, unspecified: Secondary | ICD-10-CM | POA: Diagnosis not present

## 2018-01-06 DIAGNOSIS — F0390 Unspecified dementia without behavioral disturbance: Secondary | ICD-10-CM | POA: Diagnosis not present

## 2018-01-06 DIAGNOSIS — F323 Major depressive disorder, single episode, severe with psychotic features: Secondary | ICD-10-CM | POA: Diagnosis not present

## 2018-01-08 ENCOUNTER — Other Ambulatory Visit: Payer: Self-pay | Admitting: *Deleted

## 2018-01-08 NOTE — Patient Outreach (Signed)
[facility]) Care Management  01/08/2018  Jon Walls July 27, 1945 [id number]   Attended discharge planning meeting at [facility] in [place] with [place] Team members. PT stating patient just arrived 2 days ago to facility and is having difficulty with orthostatic hypotension. PT also stated patient struggling with safety awareness when ambulating and is unable to use an assistive device even though patient does not have good balance. Nursing stated patient having issues with depression and a new medication was started yesterday.   Attempted to visit patient at the bedside but spouse Ms. Sima Matas stated patient had just left for PT.  Spouse stated patient has recently struggled with severe depression causing him not to eat.  Spouse also stated patient has had falls at hoe recently.  [place] Management services to spouse and gave spouse a [facility] packet to look over before next week.  Spouse states she plans to take patient back to thier home at discharge. She states she has a sister and sister in law who are willing to help her with patient's care.  Spouse also stated she and the patient have been married for over 51 years.   No discharge date set at this time.  Plan to revisit patient next week.   Rutherford Limerick RN, BSN [place] Acute Care Coordinator [phone]) Business Mobile [phone]) Toll free office

## 2018-01-14 DIAGNOSIS — F0151 Vascular dementia with behavioral disturbance: Secondary | ICD-10-CM | POA: Diagnosis not present

## 2018-01-14 DIAGNOSIS — R630 Anorexia: Secondary | ICD-10-CM | POA: Diagnosis not present

## 2018-01-14 DIAGNOSIS — F333 Major depressive disorder, recurrent, severe with psychotic symptoms: Secondary | ICD-10-CM | POA: Diagnosis not present

## 2018-01-15 ENCOUNTER — Other Ambulatory Visit: Payer: Self-pay | Admitting: *Deleted

## 2018-01-15 NOTE — Patient Outreach (Signed)
Jon Walls) Care Management  01/15/2018  Jon Walls [birth year] [id number]   Attended discharge planning meeting at [place] center with [facility] UM team members.  PT stating patient ambulating with contact assist.  Nursing stating patient continues to struggle with signs of depression including poor intake. Nursing stating depression medication increased this week and is being followed by psych while at facility.  Nursing stating patient continues to have a foley. Discharge planner stating spouse wanting to take patient home next week.   Went to patient's bedside to speak with patient or spouse.  Patient asleep and did not rouse easily.  Attempted to call spouse, unsuccessful. Voicemail left with call back information.   Plan to follow up next week at facility visit. Will attempt to call spouse back also.  Rutherford Limerick RN, BSN [place] Acute Care Coordinator [phone]) Business Mobile [phone]) Toll free office

## 2018-01-22 ENCOUNTER — Other Ambulatory Visit: Payer: Self-pay | Admitting: *Deleted

## 2018-01-22 NOTE — Patient Outreach (Signed)
[facility]) Care Management  01/22/2018  Jon Walls [birth year] [id number]  Attended discharge meeting at [place] with [place] Team members, [place] and [place].  PT stated patient's ambulation had improved.  Nursing stated patient was discharging to home with spouse as caregiver today. In previous d/c planning meeting it was mentioned patient has a foley.   Stopped by patient's room and spoke with spouse and patient.  Patient ambulating without assistive device.  Spouse stating she provides transportation and right now medications are affordable.  Spouse states they are discharging to home today.  [place] Management services to patient and spouse.  Patient and spouse agreeable to services.  Patient stated his appetite had improved some.  Spouse stated patient has an appointment with PCP on this coming Monday.  Referral sent for [place] to engage for transition of care.   Plan to make [facility] UM team aware patient accepted services.   Rutherford Limerick RN, BSN [place] Acute Care Coordinator [phone]) Business Mobile [phone]) Toll free office

## 2018-01-23 ENCOUNTER — Other Ambulatory Visit: Payer: Self-pay

## 2018-01-23 NOTE — Patient Outreach (Signed)
[facility] Care Management:  Discharged from [facility] on 01/22/2018 after problems with depression, weakness and mobility.  Placed call to patient who was asleep. Spoke with wife, Fraser Din, who reports patient is very sleepy. Wife reports that this is normal when patient is on medications. Wife reports patient is eating better.  Wife reports patient has follow up appointments with primary MD next week and urologist in [place] about catheter.  PLAN: offered home visit on 01/27/2018 at 12:30pm and wife has accepted. Confirmed address.  Will complete assessment and care plan after meeting with patient.  Tomasa Rand, RN, BSN, CEN [facility] [employer] [phone]

## 2018-01-26 DIAGNOSIS — I82499 Acute embolism and thrombosis of other specified deep vein of unspecified lower extremity: Secondary | ICD-10-CM | POA: Diagnosis not present

## 2018-01-26 DIAGNOSIS — I10 Essential (primary) hypertension: Secondary | ICD-10-CM | POA: Diagnosis not present

## 2018-01-26 DIAGNOSIS — G960 Cerebrospinal fluid leak: Secondary | ICD-10-CM | POA: Diagnosis not present

## 2018-01-26 DIAGNOSIS — I2699 Other pulmonary embolism without acute cor pulmonale: Secondary | ICD-10-CM | POA: Diagnosis not present

## 2018-01-26 DIAGNOSIS — N201 Calculus of ureter: Secondary | ICD-10-CM | POA: Diagnosis not present

## 2018-01-26 DIAGNOSIS — N400 Enlarged prostate without lower urinary tract symptoms: Secondary | ICD-10-CM | POA: Diagnosis not present

## 2018-01-26 DIAGNOSIS — F329 Major depressive disorder, single episode, unspecified: Secondary | ICD-10-CM | POA: Diagnosis not present

## 2018-01-26 DIAGNOSIS — Z7901 Long term (current) use of anticoagulants: Secondary | ICD-10-CM | POA: Diagnosis not present

## 2018-01-26 DIAGNOSIS — F4321 Adjustment disorder with depressed mood: Secondary | ICD-10-CM | POA: Diagnosis not present

## 2018-01-26 DIAGNOSIS — G903 Multi-system degeneration of the autonomic nervous system: Secondary | ICD-10-CM | POA: Diagnosis not present

## 2018-01-27 ENCOUNTER — Other Ambulatory Visit: Payer: Self-pay

## 2018-01-27 DIAGNOSIS — N3000 Acute cystitis without hematuria: Secondary | ICD-10-CM | POA: Diagnosis not present

## 2018-01-27 DIAGNOSIS — R338 Other retention of urine: Secondary | ICD-10-CM | POA: Diagnosis not present

## 2018-01-27 DIAGNOSIS — N3090 Cystitis, unspecified without hematuria: Secondary | ICD-10-CM | POA: Diagnosis not present

## 2018-01-27 NOTE — Patient Outreach (Signed)
[facility]) Care Management  01/27/2018  Markevius Trombetta Mar 16, 1946 [id number]   12:30pm  Arrived for scheduled initial home visit. No answer at door. Called patient x 2 with no answer.  Left my card in the door.  PLAN: will attempt again to reach patient.   Tomasa Rand, RN, BSN, CEN [facility] [employer] [phone]

## 2018-01-28 ENCOUNTER — Ambulatory Visit: Payer: Self-pay

## 2018-01-29 ENCOUNTER — Other Ambulatory Visit: Payer: Self-pay

## 2018-01-29 NOTE — Patient Outreach (Signed)
Transition of care: Incoming call from wife, who reports that patient is doing well. Reports patient has follow up with urology next week. Currently being treated for a UTI.  Denies any new concerns and apologizes for missing home visit.  PLAN: scheduled telephone call 02/04/2018 for follow up.  Tomasa Rand, RN, BSN, CEN [facility] [employer] [phone]

## 2018-01-29 NOTE — Patient Outreach (Signed)
Transition of care:  Placed call to patient after home visit no show. No answer.  PLAN: left a message requesting a call back. Will send outreach letter and attempt again in 3-4 days.  Tomasa Rand, RN, BSN, CEN [facility] [employer] [phone]

## 2018-02-02 ENCOUNTER — Ambulatory Visit: Payer: PPO

## 2018-02-03 DIAGNOSIS — N3020 Other chronic cystitis without hematuria: Secondary | ICD-10-CM | POA: Diagnosis not present

## 2018-02-03 DIAGNOSIS — N401 Enlarged prostate with lower urinary tract symptoms: Secondary | ICD-10-CM | POA: Diagnosis not present

## 2018-02-03 DIAGNOSIS — N318 Other neuromuscular dysfunction of bladder: Secondary | ICD-10-CM | POA: Diagnosis not present

## 2018-02-04 ENCOUNTER — Other Ambulatory Visit: Payer: Self-pay

## 2018-02-04 NOTE — Patient Outreach (Signed)
Case closure:  Placed call to patient and spoke with wife who states that patient is doing very well and does not need [facility] services any longer. Wife agreed to letter and will call in the future for needs.  PLAN: close case at wife's request.No show for scheduled home visit. Difficultly to engage via phone.   Will send letter of case closure to MD and patient.  Tomasa Rand, RN, BSN, CEN [facility] [employer] [phone]

## 2018-02-10 DIAGNOSIS — R351 Nocturia: Secondary | ICD-10-CM | POA: Diagnosis not present

## 2018-02-10 DIAGNOSIS — N318 Other neuromuscular dysfunction of bladder: Secondary | ICD-10-CM | POA: Diagnosis not present

## 2018-02-10 DIAGNOSIS — N3020 Other chronic cystitis without hematuria: Secondary | ICD-10-CM | POA: Diagnosis not present

## 2018-02-10 DIAGNOSIS — N401 Enlarged prostate with lower urinary tract symptoms: Secondary | ICD-10-CM | POA: Diagnosis not present

## 2018-02-11 DIAGNOSIS — F29 Unspecified psychosis not due to a substance or known physiological condition: Secondary | ICD-10-CM | POA: Diagnosis not present

## 2018-02-17 DIAGNOSIS — I10 Essential (primary) hypertension: Secondary | ICD-10-CM | POA: Diagnosis not present

## 2018-02-17 DIAGNOSIS — F4321 Adjustment disorder with depressed mood: Secondary | ICD-10-CM | POA: Diagnosis not present

## 2018-02-17 DIAGNOSIS — E7800 Pure hypercholesterolemia, unspecified: Secondary | ICD-10-CM | POA: Diagnosis not present

## 2018-02-17 DIAGNOSIS — Z7901 Long term (current) use of anticoagulants: Secondary | ICD-10-CM | POA: Diagnosis not present

## 2018-02-17 DIAGNOSIS — R7301 Impaired fasting glucose: Secondary | ICD-10-CM | POA: Diagnosis not present

## 2018-02-17 DIAGNOSIS — F329 Major depressive disorder, single episode, unspecified: Secondary | ICD-10-CM | POA: Diagnosis not present

## 2018-02-17 DIAGNOSIS — F331 Major depressive disorder, recurrent, moderate: Secondary | ICD-10-CM | POA: Diagnosis not present

## 2018-02-17 DIAGNOSIS — N400 Enlarged prostate without lower urinary tract symptoms: Secondary | ICD-10-CM | POA: Diagnosis not present

## 2018-02-19 DIAGNOSIS — E7800 Pure hypercholesterolemia, unspecified: Secondary | ICD-10-CM | POA: Diagnosis not present

## 2018-02-19 DIAGNOSIS — N138 Other obstructive and reflux uropathy: Secondary | ICD-10-CM | POA: Diagnosis not present

## 2018-02-19 DIAGNOSIS — I10 Essential (primary) hypertension: Secondary | ICD-10-CM | POA: Diagnosis not present

## 2018-02-19 DIAGNOSIS — Z79899 Other long term (current) drug therapy: Secondary | ICD-10-CM | POA: Diagnosis not present

## 2018-02-19 DIAGNOSIS — Z86718 Personal history of other venous thrombosis and embolism: Secondary | ICD-10-CM | POA: Diagnosis not present

## 2018-02-19 DIAGNOSIS — Z7901 Long term (current) use of anticoagulants: Secondary | ICD-10-CM | POA: Diagnosis not present

## 2018-02-19 DIAGNOSIS — F329 Major depressive disorder, single episode, unspecified: Secondary | ICD-10-CM | POA: Diagnosis not present

## 2018-02-19 DIAGNOSIS — Z87891 Personal history of nicotine dependence: Secondary | ICD-10-CM | POA: Diagnosis not present

## 2018-02-19 DIAGNOSIS — R351 Nocturia: Secondary | ICD-10-CM | POA: Diagnosis not present

## 2018-02-19 DIAGNOSIS — N401 Enlarged prostate with lower urinary tract symptoms: Secondary | ICD-10-CM | POA: Diagnosis not present

## 2018-02-20 DIAGNOSIS — N3020 Other chronic cystitis without hematuria: Secondary | ICD-10-CM | POA: Diagnosis not present

## 2018-02-20 DIAGNOSIS — N401 Enlarged prostate with lower urinary tract symptoms: Secondary | ICD-10-CM | POA: Diagnosis not present

## 2018-02-20 DIAGNOSIS — R351 Nocturia: Secondary | ICD-10-CM | POA: Diagnosis not present

## 2018-02-27 DIAGNOSIS — N318 Other neuromuscular dysfunction of bladder: Secondary | ICD-10-CM | POA: Diagnosis not present

## 2018-02-27 DIAGNOSIS — N3020 Other chronic cystitis without hematuria: Secondary | ICD-10-CM | POA: Diagnosis not present

## 2018-02-27 DIAGNOSIS — N401 Enlarged prostate with lower urinary tract symptoms: Secondary | ICD-10-CM | POA: Diagnosis not present

## 2018-02-27 DIAGNOSIS — R351 Nocturia: Secondary | ICD-10-CM | POA: Diagnosis not present

## 2018-03-02 DIAGNOSIS — D539 Nutritional anemia, unspecified: Secondary | ICD-10-CM | POA: Diagnosis not present

## 2018-03-02 DIAGNOSIS — Z5181 Encounter for therapeutic drug level monitoring: Secondary | ICD-10-CM | POA: Diagnosis not present

## 2018-03-02 DIAGNOSIS — I2699 Other pulmonary embolism without acute cor pulmonale: Secondary | ICD-10-CM | POA: Diagnosis not present

## 2018-03-02 DIAGNOSIS — Z7901 Long term (current) use of anticoagulants: Secondary | ICD-10-CM | POA: Diagnosis not present

## 2018-03-02 DIAGNOSIS — I82499 Acute embolism and thrombosis of other specified deep vein of unspecified lower extremity: Secondary | ICD-10-CM | POA: Diagnosis not present

## 2018-03-02 DIAGNOSIS — D6859 Other primary thrombophilia: Secondary | ICD-10-CM | POA: Diagnosis not present

## 2018-03-10 DIAGNOSIS — F29 Unspecified psychosis not due to a substance or known physiological condition: Secondary | ICD-10-CM | POA: Diagnosis not present

## 2018-03-18 DIAGNOSIS — H401131 Primary open-angle glaucoma, bilateral, mild stage: Secondary | ICD-10-CM | POA: Diagnosis not present

## 2018-03-24 DIAGNOSIS — Z8639 Personal history of other endocrine, nutritional and metabolic disease: Secondary | ICD-10-CM | POA: Diagnosis not present

## 2018-03-30 DIAGNOSIS — N401 Enlarged prostate with lower urinary tract symptoms: Secondary | ICD-10-CM | POA: Diagnosis not present

## 2018-03-30 DIAGNOSIS — N3020 Other chronic cystitis without hematuria: Secondary | ICD-10-CM | POA: Diagnosis not present

## 2018-03-30 DIAGNOSIS — N318 Other neuromuscular dysfunction of bladder: Secondary | ICD-10-CM | POA: Diagnosis not present

## 2018-05-07 DIAGNOSIS — F29 Unspecified psychosis not due to a substance or known physiological condition: Secondary | ICD-10-CM | POA: Diagnosis not present

## 2018-05-11 DIAGNOSIS — Z8639 Personal history of other endocrine, nutritional and metabolic disease: Secondary | ICD-10-CM | POA: Diagnosis not present

## 2018-05-13 DIAGNOSIS — Z79899 Other long term (current) drug therapy: Secondary | ICD-10-CM | POA: Diagnosis not present

## 2018-06-24 DIAGNOSIS — Z8639 Personal history of other endocrine, nutritional and metabolic disease: Secondary | ICD-10-CM | POA: Diagnosis not present

## 2018-06-29 DIAGNOSIS — N3020 Other chronic cystitis without hematuria: Secondary | ICD-10-CM | POA: Diagnosis not present

## 2018-06-29 DIAGNOSIS — N401 Enlarged prostate with lower urinary tract symptoms: Secondary | ICD-10-CM | POA: Diagnosis not present

## 2018-06-29 DIAGNOSIS — N400 Enlarged prostate without lower urinary tract symptoms: Secondary | ICD-10-CM | POA: Diagnosis not present

## 2018-06-29 DIAGNOSIS — R351 Nocturia: Secondary | ICD-10-CM | POA: Diagnosis not present

## 2018-08-04 IMAGING — CT CT HEAD W/O CM
1 series · 16 of 30 positions shown, 20 images · non-contrast
Comparison: Head CT 05/12/2017 and MRI 05/13/2017

CLINICAL DATA: Subdural hygroma follow-up. Dizziness for a few
months.

EXAM:
CT HEAD WITHOUT CONTRAST
TECHNIQUE: Contiguous axial images were obtained from the base of the skull
through the vertex without intravenous contrast.

[Series 2: head w/(date) · axial · 0.43mm/px · z∈[-227,-82]mm · 16 of 33 slices shown, 20 images]
[im 2/33  brain]
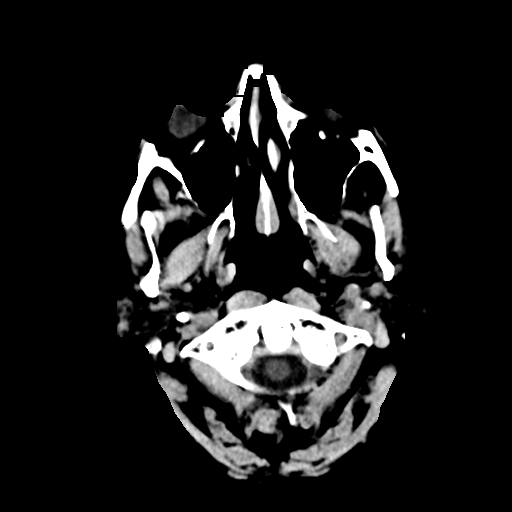
[im 2/33  bone]
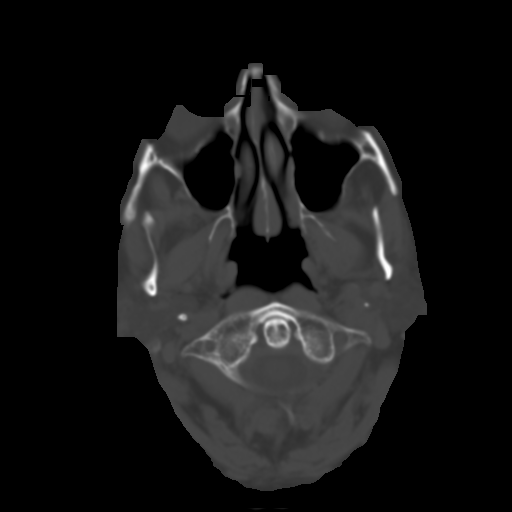
[im 4/33  brain]
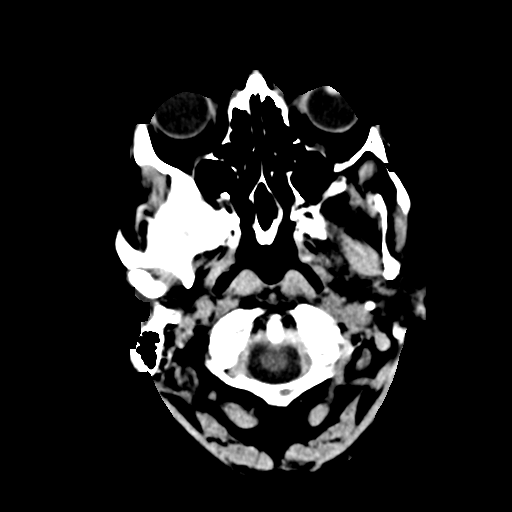
[im 6/33  brain]
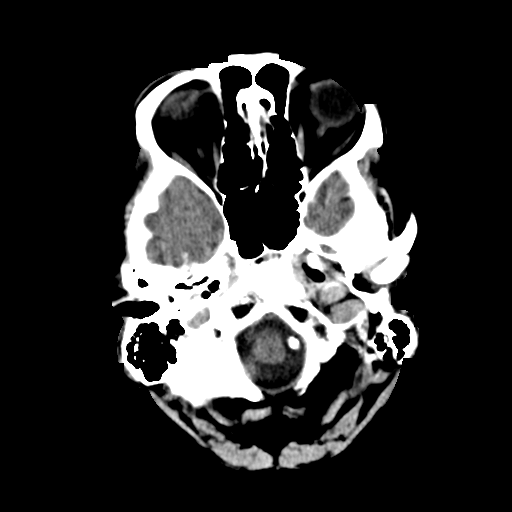
[im 8/33  brain]
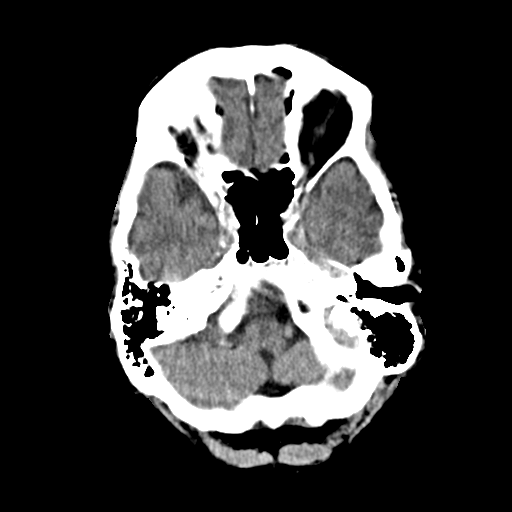
[im 9/33  brain]
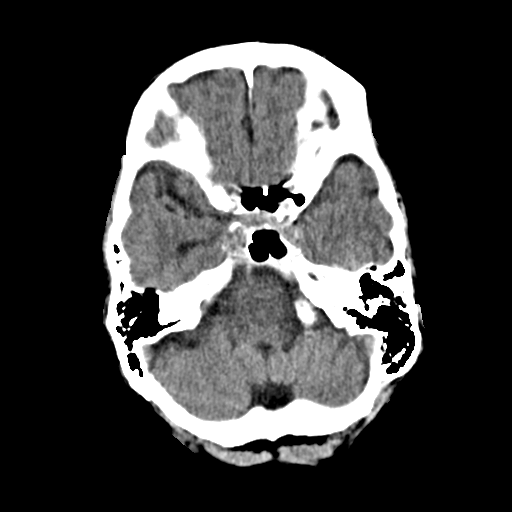
[im 9/33  bone]
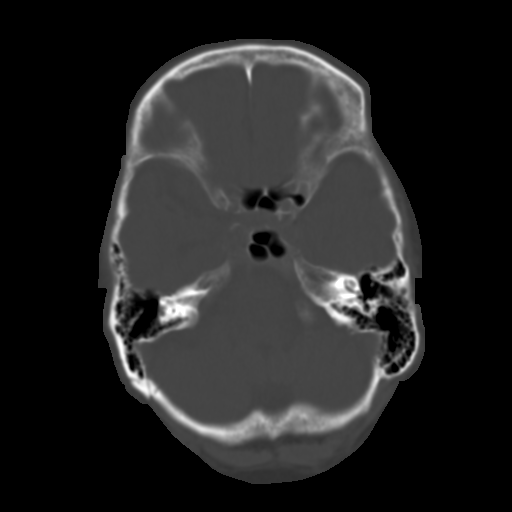
[im 12/33  brain]
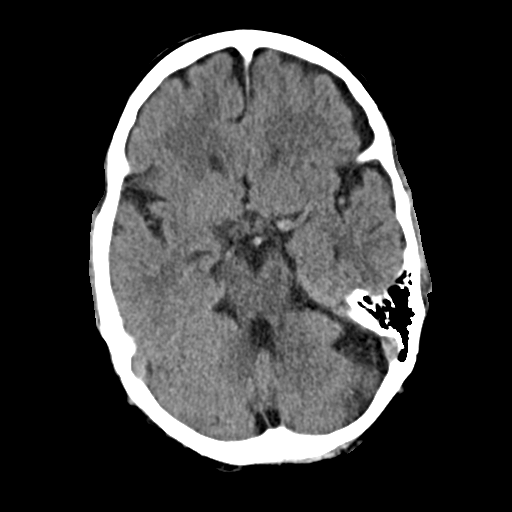
[im 14/33  brain]
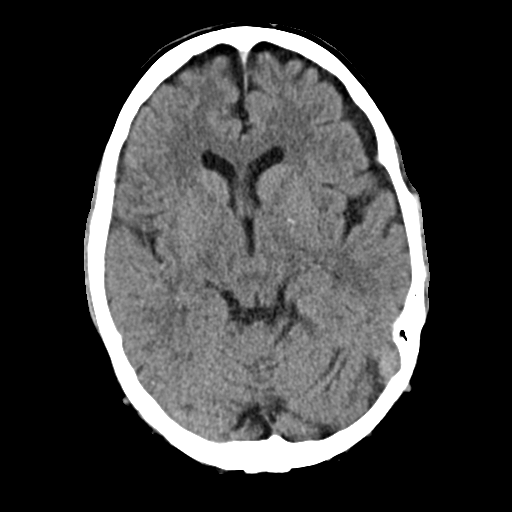
[im 16/33  brain]
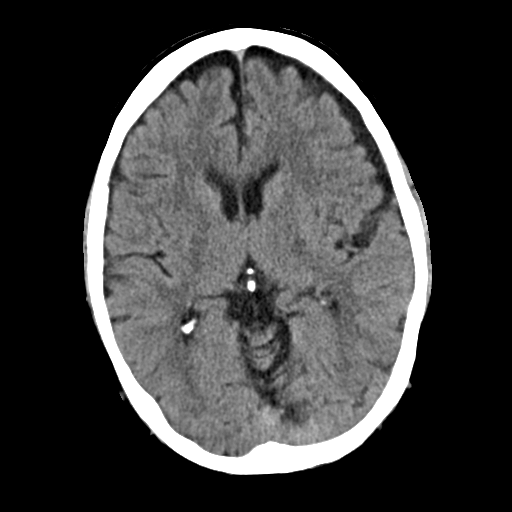
[im 17/33  brain]
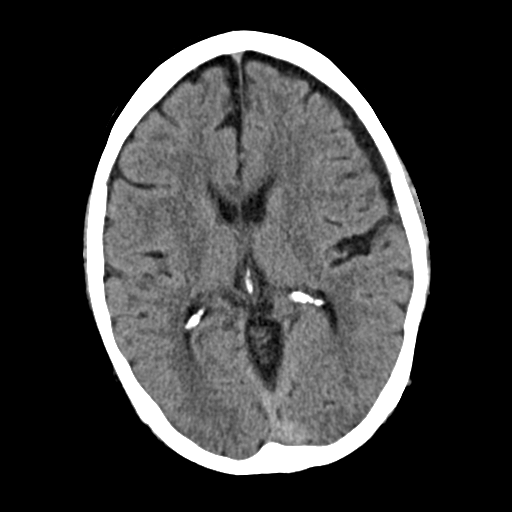
[im 17/33  bone]
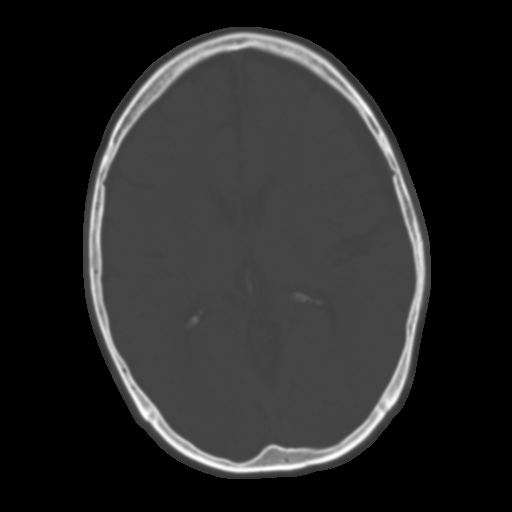
[im 19/33  brain]
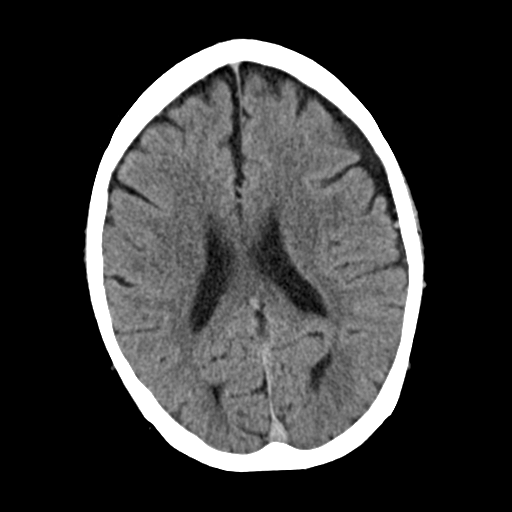
[im 21/33  brain]
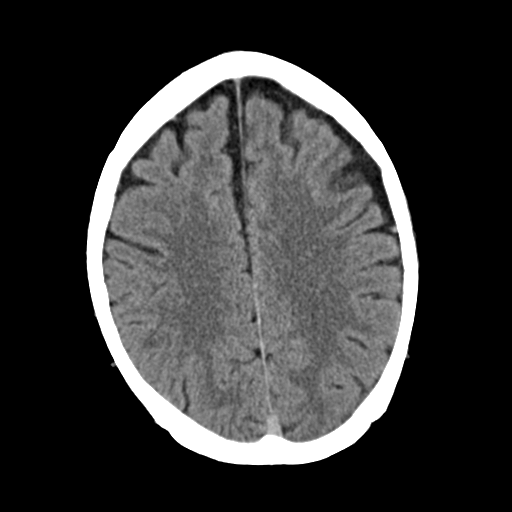
[im 24/33  brain]
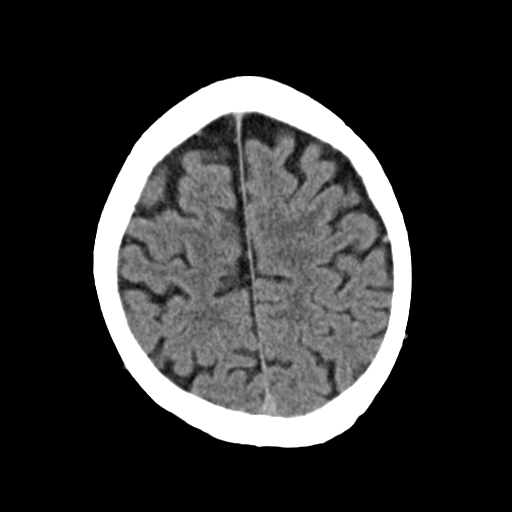
[im 25/33  brain]
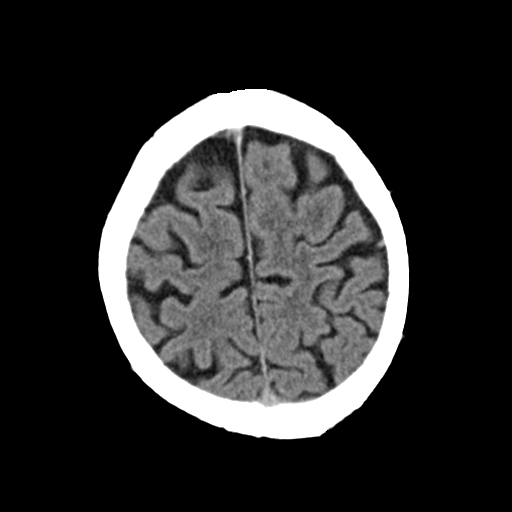
[im 25/33  bone]
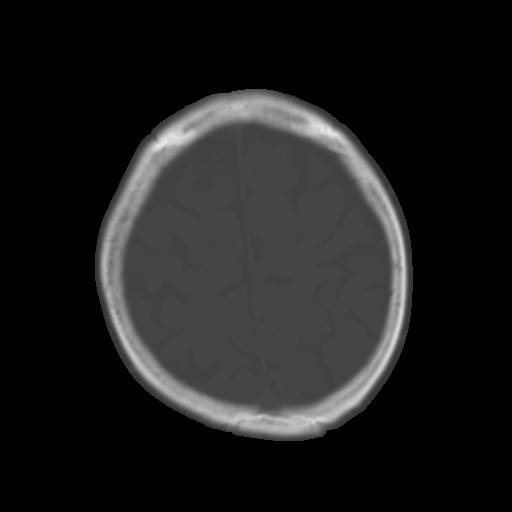
[im 27/33  brain]
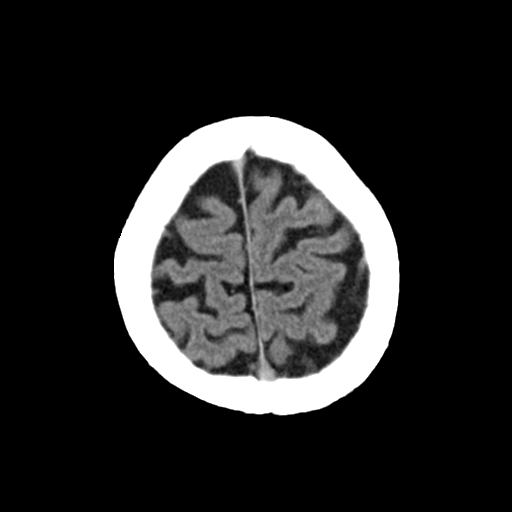
[im 29/33  brain]
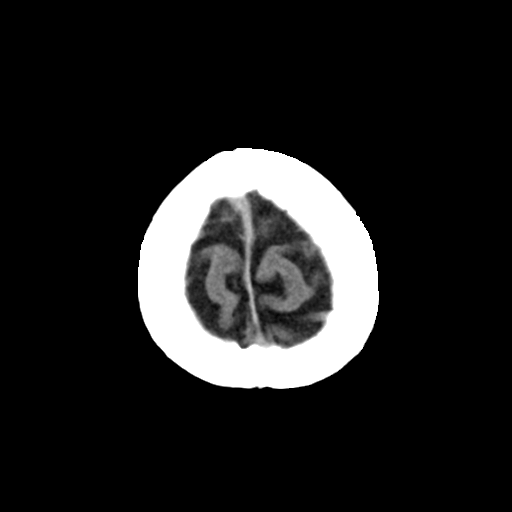
[im 31/33  brain]
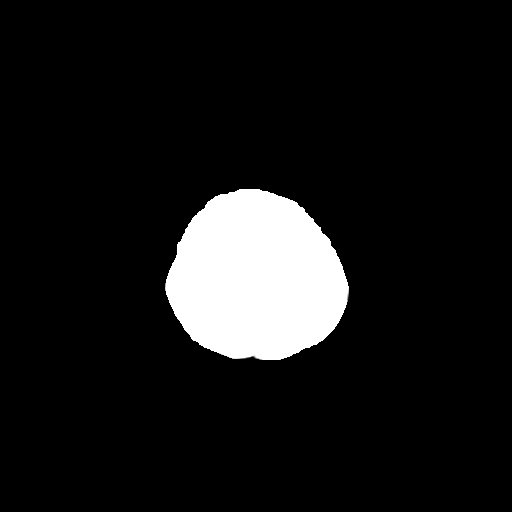

[16 of 30 positions shown; findings below may reference images not displayed]

FINDINGS: Brain: Small CSF density fluid collection over the left frontal
convexity is unchanged and measures up to approximately 5 mm in
thickness. Slight mass effect on the underlying left frontal lobe is
unchanged, as is 4 mm of rightward midline shift. The ventricles are
normal in size. There is no evidence of acute infarct, acute
intracranial hemorrhage, or mass.

Vascular: Calcified atherosclerosis at the skull base. No hyperdense
vessel.

Skull: No fracture or focal osseous lesion.

Sinuses/Orbits: Visualized paranasal sinuses and mastoid air cells
are clear. Orbits are unremarkable.

Other: None.
IMPRESSION: 1. Unchanged small left frontal subdural hygroma and 4 mm of
rightward midline shift.
2. No evidence of acute intracranial abnormality.

## 2018-08-19 DIAGNOSIS — F4321 Adjustment disorder with depressed mood: Secondary | ICD-10-CM | POA: Diagnosis not present

## 2018-08-19 DIAGNOSIS — Z0000 Encounter for general adult medical examination without abnormal findings: Secondary | ICD-10-CM | POA: Diagnosis not present

## 2018-08-19 DIAGNOSIS — K760 Fatty (change of) liver, not elsewhere classified: Secondary | ICD-10-CM | POA: Diagnosis not present

## 2018-08-19 DIAGNOSIS — E43 Unspecified severe protein-calorie malnutrition: Secondary | ICD-10-CM | POA: Diagnosis not present

## 2018-08-19 DIAGNOSIS — R7301 Impaired fasting glucose: Secondary | ICD-10-CM | POA: Diagnosis not present

## 2018-08-19 DIAGNOSIS — F331 Major depressive disorder, recurrent, moderate: Secondary | ICD-10-CM | POA: Diagnosis not present

## 2018-08-19 DIAGNOSIS — H905 Unspecified sensorineural hearing loss: Secondary | ICD-10-CM | POA: Diagnosis not present

## 2018-08-19 DIAGNOSIS — E7800 Pure hypercholesterolemia, unspecified: Secondary | ICD-10-CM | POA: Diagnosis not present

## 2018-08-19 DIAGNOSIS — I10 Essential (primary) hypertension: Secondary | ICD-10-CM | POA: Diagnosis not present

## 2018-08-19 DIAGNOSIS — Z7901 Long term (current) use of anticoagulants: Secondary | ICD-10-CM | POA: Diagnosis not present

## 2018-08-31 DIAGNOSIS — I2699 Other pulmonary embolism without acute cor pulmonale: Secondary | ICD-10-CM | POA: Diagnosis not present

## 2018-08-31 DIAGNOSIS — Z7901 Long term (current) use of anticoagulants: Secondary | ICD-10-CM | POA: Diagnosis not present

## 2018-08-31 DIAGNOSIS — I82432 Acute embolism and thrombosis of left popliteal vein: Secondary | ICD-10-CM | POA: Diagnosis not present

## 2018-08-31 DIAGNOSIS — I82499 Acute embolism and thrombosis of other specified deep vein of unspecified lower extremity: Secondary | ICD-10-CM | POA: Diagnosis not present

## 2018-08-31 DIAGNOSIS — I82412 Acute embolism and thrombosis of left femoral vein: Secondary | ICD-10-CM | POA: Diagnosis not present

## 2018-09-15 DIAGNOSIS — H35372 Puckering of macula, left eye: Secondary | ICD-10-CM | POA: Diagnosis not present

## 2018-09-15 DIAGNOSIS — H401131 Primary open-angle glaucoma, bilateral, mild stage: Secondary | ICD-10-CM | POA: Diagnosis not present

## 2018-10-19 DIAGNOSIS — K5909 Other constipation: Secondary | ICD-10-CM | POA: Diagnosis not present

## 2018-10-23 DIAGNOSIS — Z8639 Personal history of other endocrine, nutritional and metabolic disease: Secondary | ICD-10-CM | POA: Diagnosis not present

## 2018-11-09 DIAGNOSIS — L219 Seborrheic dermatitis, unspecified: Secondary | ICD-10-CM | POA: Diagnosis not present

## 2018-11-17 DIAGNOSIS — F331 Major depressive disorder, recurrent, moderate: Secondary | ICD-10-CM | POA: Diagnosis not present

## 2018-12-07 DIAGNOSIS — K5900 Constipation, unspecified: Secondary | ICD-10-CM | POA: Diagnosis not present

## 2018-12-07 DIAGNOSIS — F4321 Adjustment disorder with depressed mood: Secondary | ICD-10-CM | POA: Diagnosis not present

## 2018-12-07 DIAGNOSIS — K5909 Other constipation: Secondary | ICD-10-CM | POA: Diagnosis not present

## 2018-12-07 DIAGNOSIS — K760 Fatty (change of) liver, not elsewhere classified: Secondary | ICD-10-CM | POA: Diagnosis not present

## 2018-12-15 DIAGNOSIS — F331 Major depressive disorder, recurrent, moderate: Secondary | ICD-10-CM | POA: Diagnosis not present

## 2018-12-15 DIAGNOSIS — F4321 Adjustment disorder with depressed mood: Secondary | ICD-10-CM | POA: Diagnosis not present

## 2018-12-15 DIAGNOSIS — K5909 Other constipation: Secondary | ICD-10-CM | POA: Diagnosis not present

## 2018-12-15 DIAGNOSIS — R630 Anorexia: Secondary | ICD-10-CM | POA: Diagnosis not present

## 2018-12-15 DIAGNOSIS — Z8639 Personal history of other endocrine, nutritional and metabolic disease: Secondary | ICD-10-CM | POA: Diagnosis not present

## 2018-12-15 DIAGNOSIS — R634 Abnormal weight loss: Secondary | ICD-10-CM | POA: Diagnosis not present

## 2018-12-23 DIAGNOSIS — F4321 Adjustment disorder with depressed mood: Secondary | ICD-10-CM | POA: Diagnosis not present

## 2018-12-23 DIAGNOSIS — R413 Other amnesia: Secondary | ICD-10-CM | POA: Diagnosis not present

## 2018-12-29 ENCOUNTER — Other Ambulatory Visit: Payer: Self-pay

## 2018-12-29 ENCOUNTER — Encounter (HOSPITAL_COMMUNITY): Payer: Self-pay | Admitting: *Deleted

## 2018-12-29 ENCOUNTER — Emergency Department (HOSPITAL_COMMUNITY)
Admission: EM | Admit: 2018-12-29 | Discharge: 2018-12-30 | Disposition: A | Discharge status: Home / Self Care | Payer: PPO | Attending: Emergency Medicine | Admitting: Emergency Medicine

## 2018-12-29 DIAGNOSIS — F322 Major depressive disorder, single episode, severe without psychotic features: Secondary | ICD-10-CM | POA: Diagnosis not present

## 2018-12-29 DIAGNOSIS — R630 Anorexia: Secondary | ICD-10-CM | POA: Diagnosis not present

## 2018-12-29 DIAGNOSIS — R531 Weakness: Secondary | ICD-10-CM | POA: Diagnosis not present

## 2018-12-29 DIAGNOSIS — Z7982 Long term (current) use of aspirin: Secondary | ICD-10-CM | POA: Insufficient documentation

## 2018-12-29 DIAGNOSIS — R5383 Other fatigue: Secondary | ICD-10-CM | POA: Diagnosis not present

## 2018-12-29 HISTORY — DX: Personal history of other venous thrombosis and embolism: Z86.718

## 2018-12-29 HISTORY — DX: Depression, unspecified: F32.A

## 2018-12-29 LAB — BASIC METABOLIC PANEL
Anion gap: 10 (ref 5–15)
BUN: 15 mg/dL (ref 8–23)
CO2: 25 mmol/L (ref 22–32)
Calcium: 9.6 mg/dL (ref 8.9–10.3)
Chloride: 101 mmol/L (ref 98–111)
Creatinine, Ser: 0.92 mg/dL (ref 0.61–1.24)
GFR calc Af Amer: >60 mL/min (ref >60)
GFR calc non Af Amer: >60 mL/min (ref >60)
Glucose, Bld: 173 mg/dL — ABNORMAL HIGH (ref 70–99)
Potassium: 3.9 mmol/L (ref 3.5–5.1)
Sodium: 136 mmol/L (ref 135–145)

## 2018-12-29 LAB — HEPATIC FUNCTION PANEL
ALT: 19 U/L (ref 0–44)
AST: 19 U/L (ref 15–41)
Albumin: 3.9 g/dL (ref 3.5–5.0)
Alkaline Phosphatase: 66 U/L (ref 38–126)
Bilirubin, Direct: 0.2 mg/dL (ref 0.0–0.2)
Indirect Bilirubin: 1.1 mg/dL — ABNORMAL HIGH (ref 0.3–0.9)
Total Bilirubin: 1.3 mg/dL — ABNORMAL HIGH (ref 0.3–1.2)
Total Protein: 6.8 g/dL (ref 6.5–8.1)

## 2018-12-29 LAB — AMMONIA: Ammonia: 10 umol/L (ref 9–35)

## 2018-12-29 LAB — CBC
HCT: 47.5 % (ref 39.0–52.0)
Hemoglobin: 17 g/dL (ref 13.0–17.0)
MCH: 33.5 pg (ref 26.0–34.0)
MCHC: 35.8 g/dL (ref 30.0–36.0)
MCV: 93.7 fL (ref 80.0–100.0)
Platelets: 207 10*3/uL (ref 150–400)
RBC: 5.07 MIL/uL (ref 4.22–5.81)
RDW: 12.5 % (ref 11.5–15.5)
WBC: 4.2 10*3/uL (ref 4.0–10.5)
nRBC: 0 % (ref 0.0–0.2)

## 2018-12-29 LAB — CK: Total CK: 28 U/L — ABNORMAL LOW (ref 49–397)

## 2018-12-29 MED ORDER — SODIUM CHLORIDE 0.9 % IV BOLUS
1000.0000 mL | Freq: Once | INTRAVENOUS | Status: AC
  Administered 2018-12-29: 1000 mL via INTRAVENOUS

## 2018-12-29 NOTE — ED Notes (Signed)
Jon Householder, MD gave VERBAL order for CK & UA

## 2018-12-29 NOTE — ED Notes (Signed)
PT aware for the need for urine. PT says he feels like he has no pee. PT bladder scanned at >100 ml.

## 2018-12-29 NOTE — ED Triage Notes (Signed)
Pts wife reports the pt not wanting to eat for 2 mths, pt reports that his PCP reports he needs a mental health eval, pt reported to not be taking his medications yesterday, pt states, "one thing that needs to be clear is that I have gotten too weak to take it." pt denies pain, pts wife concerned the pt is depressed, denies SI & HI, pt A&O x4

## 2018-12-29 NOTE — ED Provider Notes (Signed)
[facility] EMERGENCY DEPARTMENT Provider Note   CSN: [id number] Arrival date & time: 12/29/18  1238     History   Chief Complaint Chief Complaint  Patient presents with  . Eating Disorder    HPI Jon Walls is a 73 y.o. male with a past medical history of depression presents to ED for evaluation of decreased appetite, weight loss, generalized weakness for the past 2 months, noncompliance with medication.  Majority of history is provided by wife at bedside.  States that she is concerned that he is not eating.  States that "he does not eat anything, sometimes he will just eat a scoop of peanut butter."  States that for the past 2 days she has been tried giving him Ensure shakes.  States that he does not want to eat because "I am constipated."  Wife states that he is not constipated but also states he has not had a bowel movement in 2 weeks.  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  He was evaluated by his PCP and was told "there is nothing else I could do, if this is psychiatric I am just a primary care provider."  He saw a neurologist since symptoms began and was told that he could have mild dementia based on exam.  He denies any SI, HI, AVH, vomiting, abdominal pain, chest pain, shortness of breath, injuries or falls, numbness arms or legs, headache, vision changes.  He has been passing gas as usual.  Of note, patient is unable to tell me what exactly is going on in his own words.  He states that "I do not think I can get any food down because I am just constipated."  States that he just feels overall weak with low energy and no appetite but is telling his wife that he is okay, does not need to be here and is wasting time.  He does not feel that his medications are helping him.     HPI  Past Medical History:  Diagnosis Date  . Depression   . H/O  blood clots     There are no active problems to display for this patient.   Past Surgical History:  Procedure Laterality Date  . HERNIA REPAIR          Home Medications    Prior to Admission medications   Medication Sig Start Date End Date Taking? Authorizing Provider  aspirin EC 81 MG tablet Take by mouth.    [provider]  clonazePAM (KLONOPIN) 0.5 MG tablet Take 0.5 mg by mouth at bedtime. 05/09/17   [provider]  DOCOSAHEXAENOIC ACID PO Take 1 g by mouth.    [provider]  Garlic Oil 2 MG CAPS Take by mouth.    [provider]  Latanoprost 0.005 % EMUL 2 drops daily. 04/05/17   [provider]  lisinopril (PRINIVIL,ZESTRIL) 10 MG tablet Take by mouth. 05/21/16 05/21/17  [provider]  Saw Palmetto 160 MG CAPS Take by mouth.    [provider]  simvastatin (ZOCOR) 20 MG tablet TAKE ONE TABLET BY MOUTH ONCE DAILY 11/05/16   [provider]  tamsulosin (FLOMAX) 0.4 MG CAPS capsule Take 0.4 mg by mouth 2 (two) times daily before a meal.    [provider]  timolol (TIMOPTIC) 0.5 % ophthalmic solution 1 drop  2 times daily.    [provider]    Family History No family history on file.  Social History Social History   Tobacco Use  . Smoking status: Never Smoker  . Smokeless tobacco: Never Used  Substance Use Topics  . Alcohol use: Not Currently  . Drug use: Not Currently     Allergies   Patient has no known allergies.   Review of Systems Review of Systems  Constitutional: Positive for activity change, appetite change, fatigue and unexpected weight change. Negative for chills and fever.  HENT: Negative for ear pain, rhinorrhea, sneezing and sore throat.   Eyes: Negative for photophobia and visual disturbance.  Respiratory: Negative for cough, chest tightness, shortness of breath and wheezing.   Cardiovascular: Negative for chest pain and palpitations.  Gastrointestinal:  Negative for abdominal pain, blood in stool, constipation, diarrhea, nausea and vomiting.  Genitourinary: Negative for dysuria, hematuria and urgency.  Musculoskeletal: Negative for myalgias.  Skin: Negative for rash.  Neurological: Negative for dizziness, weakness and light-headedness.     Physical Exam Updated Vital Signs BP 105/80 (BP Location: Left Arm)   Pulse 64   Temp 97.6 F (36.4 C) (Oral)   Resp 18   SpO2 99%   Physical Exam Vitals signs and nursing note reviewed.  Constitutional:      General: He is not in acute distress.    Appearance: He is well-developed.  HENT:     Head: Normocephalic and atraumatic.     Nose: Nose normal.  Eyes:     General: No scleral icterus.       Right eye: No discharge.        Left eye: No discharge.     Conjunctiva/sclera: Conjunctivae normal.     Pupils: Pupils are equal, round, and reactive to light.  Neck:     Musculoskeletal: Normal range of motion and neck supple.  Cardiovascular:     Rate and Rhythm: Normal rate and regular rhythm.     Heart sounds: Normal heart sounds. No murmur. No friction rub. No gallop.   Pulmonary:     Effort: Pulmonary effort is normal. No respiratory distress.     Breath sounds: Normal breath sounds.  Abdominal:     General: Bowel sounds are normal. There is no distension.     Palpations: Abdomen is soft.     Tenderness: There is no abdominal tenderness. There is no guarding.  Musculoskeletal: Normal range of motion.  Skin:    General: Skin is warm and dry.     Findings: No rash.  Neurological:     General: No focal deficit present.     Mental Status: He is alert and oriented to person, place, and time. Mental status is at baseline.     Cranial Nerves: No cranial nerve deficit.     Sensory: No sensory deficit.     Motor: No weakness or abnormal muscle tone.     Coordination: Coordination normal.     Comments: Alert and oriented to self, place, time situation family member. Pupils reactive. No  facial asymmetry noted. Cranial nerves appear grossly intact. Sensation intact to light touch on face, BUE and BLE. Strength 5/5 in BUE and BLE.      ED Treatments / Results  Labs (all labs ordered are listed, but only abnormal results are displayed) Labs Reviewed  BASIC METABOLIC PANEL - Abnormal; Notable for the following components:      Result Value   Glucose, Bld 173 (*)  All other components within normal limits  CK - Abnormal; Notable for the following components:   Total CK 28 (*)    All other components within normal limits  HEPATIC FUNCTION PANEL - Abnormal; Notable for the following components:   Total Bilirubin 1.3 (*)    Indirect Bilirubin 1.1 (*)    All other components within normal limits  CBC  AMMONIA  URINALYSIS, ROUTINE W REFLEX MICROSCOPIC    EKG None  Radiology No results found.  Procedures Procedures (including critical care time)  Medications Ordered in ED Medications  sodium chloride 0.9 % bolus 1,000 mL (1,000 mLs Intravenous New Bag/Given 12/29/18 2046)     Initial Impression / Assessment and Plan / ED Course  I have reviewed the triage vital signs and the nursing notes.  Pertinent labs & imaging results that were available during my care of the patient were reviewed by me and considered in my medical decision making (see chart for details).        73 year old male presents to ED for decreased appetite, weight loss, generalized weakness for the past 2 months, noncompliance with medications.  Wife is concerned that he has some psych issues going on.  He states that he is not eating because he feels constipated.  He has not had a bowel movement in 2 weeks but I feel it is more so because he has not been eating anything.  Neurologist saw him recently and was told that he could have early dementia.  He denies any abdominal pain, chest pain, SI, AVH.  No life event that prompted the symptoms.  On exam patient is overall well-appearing.  Abdomen  is soft and there are tender nondistended.  Vital signs are within normal limits.  CBC, BMP, CK, ammonia unremarkable. Urinalysis pending.  Patient awaiting TTS evaluation as requested by his PCP.  Final Clinical Impressions(s) / ED Diagnoses   Final diagnoses:  Fatigue, unspecified type    ED Discharge Orders    None       Delia Heady, PA-C 12/29/18 2350    Quintella Reichert, MD 12/30/18 2128

## 2018-12-30 LAB — URINALYSIS, ROUTINE W REFLEX MICROSCOPIC
Bilirubin Urine: NEGATIVE
Glucose, UA: NEGATIVE mg/dL
Hgb urine dipstick: NEGATIVE
Ketones, ur: 80 mg/dL — AB
Leukocytes,Ua: NEGATIVE
Nitrite: NEGATIVE
Protein, ur: NEGATIVE mg/dL
Specific Gravity, Urine: 1.016 (ref 1.005–1.030)
pH: 5 (ref 5.0–8.0)

## 2018-12-30 NOTE — ED Notes (Signed)
Called [facility] multiple times for update on status of TTS with no answer.

## 2018-12-30 NOTE — Discharge Instructions (Addendum)
Your work-up, lab work, urine today was reassuring  Please follow-up with your primary care doctor  Return to the emergency department for any chest pain, difficulty breathing, abdominal pain, vomiting, fevers, thoughts of wanting her to kill yourself or any other person or any other worsening or concerning symptoms.

## 2018-12-30 NOTE — ED Notes (Signed)
Pt verbalized understanding of discharge paperwork and reviewed with wife.

## 2018-12-30 NOTE — BH Assessment (Addendum)
Tele Assessment Note   Patient Name: Jon Walls MRN: [id number] Referring Physician: Dr. Quintella Reichert, MD Location of Patient: [facility] [place] Location of Provider: [place] [facility]  Jon Walls is a 73 y.o. male who was brought to the hospital by his wife due to ongoing difficulties eating, increased difficulties taking in water/liquids, and an ongoing refusal to take his medication over the last several days. Pt's wife shares pt's behavior has changed drastically over the last 2 - 2 1/2 months and that, since that time, he has stopped eating due to stating he is constipated, stopped taking his medication, including his Remeron, Zyprexa, and the medication that prevents his blood from clotting, and has begun to reduce the liquids he takes in. Pt's wife states these are all drastic changes from pt's previous behaviors and that she's concerned about the physical health it is having on him. Pt's wife states she took pt to a neurologist who stated that pt most likely has the early signs of dementia.  Pt denies SI, any previous history of SI, any prior attempts or plans to kill himself, or any prior hospitalizations for mental health. Pt denies HI, AVH, NSSIB, access to guns/weapons, engagement in the legal system, and any SA. Clinician inquired of pt if he lives with his wife, and pt appeared to become confused by that question, taking a moment to answer it, and then stating that no, he lives alone at [facility]. When clinician later talked to pt's wife, she confirmed that she and husband live together.  Pt provided verbal consent to clinician to contact his wife; he was able to provide clinician his wife's name but was unable to provide his wife's phone number. The information above is the information clinician was able to obtain in that phone call.  Pt was oriented x4. His recent and remote memory was UTA, as some information was not able to be verified. Pt was, overall,  cooperative throughout the assessment, though pt was quiet and appeared to be confused at times, as evidenced by him looking at his hands and taking time to respond at times. Pt's insight, judgement, and impulse control is impaired at this time.   Diagnosis: F32.2, Major depressive disorder, Single episode, Severe   Past Medical History:  Past Medical History:  Diagnosis Date  . Depression   . H/O blood clots     Past Surgical History:  Procedure Laterality Date  . HERNIA REPAIR      Family History: No family history on file.  Social History:  reports that he has never smoked. He has never used smokeless tobacco. He reports previous alcohol use. He reports previous drug use.  Additional Social History:  Alcohol / Drug Use Pain Medications: Please see MAR Prescriptions: Please see MAR Over the Counter: Please see MAR History of alcohol / drug use?: No history of alcohol / drug abuse Longest period of sobriety (when/how long): Pt denies SA  CIWA: CIWA-Ar BP: (!) 135/95 Pulse Rate: 73 COWS:    Allergies: No Known Allergies  Home Medications: (Not in a hospital admission)   OB/GYN Status:  No LMP for male patient.  General Assessment Data Assessment unable to be completed: Yes Reason for not completing assessment: (unable to reach staff) Location of Assessment: [facility] ED TTS Assessment: In system Is this a Tele or Face-to-Face Assessment?: Tele Assessment Is this an Initial Assessment or a Re-assessment for this encounter?: Initial Assessment Patient Accompanied by:: N/A Language Other than English: No Living  Arrangements: Other (Comment)(Pt lives with his wife in their home) What gender do you identify as?: Male Marital status: Married Pharmacist, community name: Jon Walls Pregnancy Status: No Living Arrangements: Spouse/significant other Can pt return to current living arrangement?: Yes Admission Status: Voluntary Is patient capable of signing voluntary admission?: Yes Referral  Source: Self/Family/Friend Insurance type: Equities trader     Crisis Care Plan Living Arrangements: Spouse/significant other Legal Guardian: Other:(Self) Name of Psychiatrist: None Name of Therapist: None  Education Status Is patient currently in school?: No Is the patient employed, unemployed or receiving disability?: Receiving disability income  Risk to self with the past 6 months Suicidal Ideation: No Has patient been a risk to self within the past 6 months prior to admission? : No Suicidal Intent: No Has patient had any suicidal intent within the past 6 months prior to admission? : Other (comment) Is patient at risk for suicide?: No Suicidal Plan?: No Has patient had any suicidal plan within the past 6 months prior to admission? : No Access to Means: No What has been your use of drugs/alcohol within the last 12 months?: Pt and his wife deny SA Previous Attempts/Gestures: No How many times?: 0 Other Self Harm Risks: Possible new dementia onset, pt is refusing to take his meds Triggers for Past Attempts: None known Intentional Self Injurious Behavior: None Family Suicide History: Unknown Recent stressful life event(s): Recent negative physical changes Persecutory voices/beliefs?: No Depression: Yes Depression Symptoms: Despondent, Fatigue, Feeling worthless/self pity, Feeling angry/irritable Substance abuse history and/or treatment for substance abuse?: No Suicide prevention information given to non-admitted patients: Not applicable  Risk to Others within the past 6 months Homicidal Ideation: No Does patient have any lifetime risk of violence toward others beyond the six months prior to admission? : No Thoughts of Harm to Others: No Current Homicidal Intent: No Current Homicidal Plan: No Access to Homicidal Means: No Identified Victim: None noted History of harm to others?: No Assessment of Violence: None Noted Violent Behavior Description: None noted Does  patient have access to weapons?: No(Pt and pt's wife deny this) Criminal Charges Pending?: No Does patient have a court date: No Is patient on probation?: No  Psychosis Hallucinations: None noted Delusions: None noted  Mental Status Report Appearance/Hygiene: In scrubs Eye Contact: Fair Motor Activity: Freedom of movement Speech: Other (Comment)(Somewhat confused) Level of Consciousness: Quiet/awake Mood: Anxious Affect: Appropriate to circumstance Anxiety Level: Minimal Thought Processes: Thought Blocking Judgement: Partial Orientation: Person, Place, Time, Situation Obsessive Compulsive Thoughts/Behaviors: None  Cognitive Functioning Concentration: Decreased Memory: Unable to Assess Is patient IDD: No Insight: Unable to Assess Impulse Control: Unable to Assess Appetite: Poor Have you had any weight changes? : Loss Amount of the weight change? (lbs): 30 lbs(30+ lost in 2 - 2 1/2 months) Sleep: Decreased Total Hours of Sleep: (UTA) Vegetative Symptoms: None  ADLScreening [facility] Assessment Services) Patient's cognitive ability adequate to safely complete daily activities?: Yes Patient able to express need for assistance with ADLs?: Yes Independently performs ADLs?: Yes (appropriate for developmental age)  Prior Inpatient Therapy Prior Inpatient Therapy: No  Prior Outpatient Therapy Prior Outpatient Therapy: No Does patient have an ACCT team?: No Does patient have Intensive In-House Services?  : No Does patient have [facility] services? : No Does patient have P4CC services?: No  ADL Screening (condition at time of admission) Patient's cognitive ability adequate to safely complete daily activities?: Yes Is the patient deaf or have difficulty hearing?: Yes Does the patient have difficulty seeing, even when wearing glasses/contacts?: (UTA)  Does the patient have difficulty concentrating, remembering, or making decisions?: Yes Patient able to express need for assistance  with ADLs?: Yes Does the patient have difficulty dressing or bathing?: No Independently performs ADLs?: Yes (appropriate for developmental age) Does the patient have difficulty walking or climbing stairs?: (UTA) Weakness of Legs: (UTA) Weakness of Arms/Hands: (UTA)  Home Assistive Devices/Equipment Home Assistive Devices/Equipment: (UTA)  Therapy Consults (therapy consults require a physician order) PT Evaluation Needed: No OT Evalulation Needed: No SLP Evaluation Needed: No Abuse/Neglect Assessment (Assessment to be complete while patient is alone) Abuse/Neglect Assessment Can Be Completed: Unable to assess, patient is non-responsive or altered mental status Values / Beliefs Cultural Requests During Hospitalization: None Spiritual Requests During Hospitalization: None Consults Spiritual Care Consult Needed: No Social Work Consult Needed: No Regulatory affairs officer (For Healthcare) Does Patient Have a Medical Advance Directive?: Unable to assess, patient is non-responsive or altered mental status Type of Advance Directive: Healthcare Power of [place], Living will Copy of [place] in Chart?: No - copy requested, Physician notified Copy of Living Will in Chart?: No - copy requested, Physician notified       Disposition: Anette Riedel, NP, reviewed pt's chart and information and determined pt does not meet criteria for inpatient hospitalization and that he can be d/c with outpatient resources.   Disposition Initial Assessment Completed for this Encounter: Yes Patient referred to: Other (Comment)(Pt should be d/c with otpt referral resources)  This service was provided via telemedicine using a 2-way, interactive audio and video technology.  Names of all persons participating in this telemedicine service and their role in this encounter. Name: Jon Walls Role: Patient  Name: Jon Walls Role: Patient's Wife  Name: Anette Riedel Role: Nurse Practitioner   Name: Windell Hummingbird Role: Clinician    Dannielle Burn 12/30/2018 7:49 AM

## 2018-12-30 NOTE — ED Notes (Signed)
Attempted to call spouse with no success

## 2018-12-30 NOTE — ED Notes (Signed)
Urine Culture attach to urine sample

## 2018-12-30 NOTE — ED Notes (Signed)
Pt waiting on TTS, [facility] called. Pt is on the list. [facility] will call for appt.

## 2018-12-30 NOTE — ED Provider Notes (Signed)
Care handed off from previous provider.  Patient is pending TTS consult.  This is a 73 year old male brought in by wife for evaluation of not wanting to eat.  Patient was sent by PCP for generalized weakness, decreased appetite x2 months.  Wife states she is concerned because patient does not wish to eat.  She reports occasionally, he will eat just a scoop of peanut butter but otherwise no other food.  Patient states that he does not want to eat because "he is constipated."  Wife does report that he has not had a good bowel movement in the last 2 weeks.  No abdominal pain, vomiting.  No SI, HI, AVH.  Please see note from previous provider for full history/physical exam.   Physical Exam  BP 136/85   Pulse 67   Temp 97.6 F (36.4 C) (Oral)   Resp 15   SpO2 98%   Physical Exam    Abdomen soft, nondistended. Rectal exam done with chaperone present.  Normal anal tone.  No fecal impaction noted.  ED Course/Procedures     Procedures  MDM    Behavioral health evaluated patient and felt like he was safe for discharge home with outpatient resources.  Patient was able to drink water here in the emergency department.  I discussed with patient regarding getting a urine but he states that "he cannot and does not want to."  Additionally, he states that he "does not want to eat." Exam is benign with no tenderness, distention noted.  History/physical exam is not concerning for infectious process, small bowel obstruction.  It is nontender with no distention.  No indication for further imaging at this time.  He does not have a surgical abdomen. Discussed patient with Dr. Stark Jock who is agreeable.    Bladder scan revealed 371 cc of urine in the bladder.  At this time, I doubt that this is retention related.  Patient refuses to try and go the bathroom.  We will plan in out cath him.  UA negative for any infectious etiology.  At this time, patient is medically cleared.  TTS has consulted and and  recommended discharge with outpatient resources. Discussed patient with Dr. Stark Jock who is agreeable. At this time, patient exhibits no emergent life-threatening condition that require further evaluation in ED or admission. Patient had ample opportunity for questions and discussion. All patient's questions were answered with full understanding. Strict return precautions discussed. Patient expresses understanding and agreement to plan.    Portions of this note were generated with [employer]. Dictation errors may occur despite best attempts at proofreading.   Volanda Napoleon, PA-C 12/30/18 1452    Veryl Speak, MD 12/30/18 [phone]

## 2018-12-30 NOTE — ED Notes (Signed)
TTS clinician attempted to complete TTS assessment multiple times, unable to reach RN or staff in that location to set of TTS machine. Spoke with reception, staff may not be available due traumas. TTS will attempt to complete assessment at later time.

## 2019-01-22 DIAGNOSIS — R918 Other nonspecific abnormal finding of lung field: Secondary | ICD-10-CM | POA: Diagnosis not present

## 2019-01-22 DIAGNOSIS — R195 Other fecal abnormalities: Secondary | ICD-10-CM | POA: Diagnosis not present

## 2019-01-22 DIAGNOSIS — R627 Adult failure to thrive: Secondary | ICD-10-CM | POA: Diagnosis not present

## 2019-01-22 DIAGNOSIS — R194 Change in bowel habit: Secondary | ICD-10-CM | POA: Diagnosis not present

## 2019-01-22 DIAGNOSIS — F333 Major depressive disorder, recurrent, severe with psychotic symptoms: Secondary | ICD-10-CM | POA: Diagnosis not present

## 2019-01-22 DIAGNOSIS — R42 Dizziness and giddiness: Secondary | ICD-10-CM | POA: Diagnosis not present

## 2019-01-22 DIAGNOSIS — Z86718 Personal history of other venous thrombosis and embolism: Secondary | ICD-10-CM | POA: Diagnosis not present

## 2019-01-25 DIAGNOSIS — E785 Hyperlipidemia, unspecified: Secondary | ICD-10-CM | POA: Diagnosis not present

## 2019-01-25 DIAGNOSIS — K5909 Other constipation: Secondary | ICD-10-CM | POA: Diagnosis not present

## 2019-01-25 DIAGNOSIS — E43 Unspecified severe protein-calorie malnutrition: Secondary | ICD-10-CM | POA: Diagnosis not present

## 2019-01-25 DIAGNOSIS — Z87828 Personal history of other (healed) physical injury and trauma: Secondary | ICD-10-CM | POA: Diagnosis not present

## 2019-01-25 DIAGNOSIS — I10 Essential (primary) hypertension: Secondary | ICD-10-CM | POA: Diagnosis not present

## 2019-01-25 DIAGNOSIS — F333 Major depressive disorder, recurrent, severe with psychotic symptoms: Secondary | ICD-10-CM | POA: Diagnosis not present

## 2019-01-25 DIAGNOSIS — Z681 Body mass index (BMI) 19 or less, adult: Secondary | ICD-10-CM | POA: Diagnosis not present

## 2019-01-25 DIAGNOSIS — Z86711 Personal history of pulmonary embolism: Secondary | ICD-10-CM | POA: Diagnosis not present

## 2019-01-25 DIAGNOSIS — E860 Dehydration: Secondary | ICD-10-CM | POA: Diagnosis not present

## 2019-01-25 DIAGNOSIS — F0151 Vascular dementia with behavioral disturbance: Secondary | ICD-10-CM | POA: Diagnosis not present

## 2019-01-25 DIAGNOSIS — R5381 Other malaise: Secondary | ICD-10-CM | POA: Diagnosis not present

## 2019-01-25 DIAGNOSIS — R7989 Other specified abnormal findings of blood chemistry: Secondary | ICD-10-CM | POA: Diagnosis not present

## 2019-01-25 DIAGNOSIS — Z9181 History of falling: Secondary | ICD-10-CM | POA: Diagnosis not present

## 2019-01-25 DIAGNOSIS — Z515 Encounter for palliative care: Secondary | ICD-10-CM | POA: Diagnosis not present

## 2019-01-25 DIAGNOSIS — E872 Acidosis: Secondary | ICD-10-CM | POA: Diagnosis not present

## 2019-01-25 DIAGNOSIS — E8889 Other specified metabolic disorders: Secondary | ICD-10-CM | POA: Diagnosis not present

## 2019-01-25 DIAGNOSIS — H409 Unspecified glaucoma: Secondary | ICD-10-CM | POA: Diagnosis not present

## 2019-01-25 DIAGNOSIS — N400 Enlarged prostate without lower urinary tract symptoms: Secondary | ICD-10-CM | POA: Diagnosis not present

## 2019-01-25 DIAGNOSIS — R627 Adult failure to thrive: Secondary | ICD-10-CM | POA: Diagnosis not present

## 2019-01-25 DIAGNOSIS — Z86718 Personal history of other venous thrombosis and embolism: Secondary | ICD-10-CM | POA: Diagnosis not present

## 2019-01-25 DIAGNOSIS — Z5329 Procedure and treatment not carried out because of patient's decision for other reasons: Secondary | ICD-10-CM | POA: Diagnosis not present

## 2019-01-25 DIAGNOSIS — E870 Hyperosmolality and hypernatremia: Secondary | ICD-10-CM | POA: Diagnosis not present

## 2019-01-25 DIAGNOSIS — Z7901 Long term (current) use of anticoagulants: Secondary | ICD-10-CM | POA: Diagnosis not present

## 2019-01-25 DIAGNOSIS — Z9114 Patient's other noncompliance with medication regimen: Secondary | ICD-10-CM | POA: Diagnosis not present

## 2019-01-25 DIAGNOSIS — Z66 Do not resuscitate: Secondary | ICD-10-CM | POA: Diagnosis not present

## 2019-01-26 DIAGNOSIS — E860 Dehydration: Secondary | ICD-10-CM | POA: Diagnosis not present

## 2019-01-26 DIAGNOSIS — F333 Major depressive disorder, recurrent, severe with psychotic symptoms: Secondary | ICD-10-CM | POA: Diagnosis not present

## 2019-01-26 DIAGNOSIS — F0151 Vascular dementia with behavioral disturbance: Secondary | ICD-10-CM | POA: Diagnosis not present

## 2019-01-26 DIAGNOSIS — E43 Unspecified severe protein-calorie malnutrition: Secondary | ICD-10-CM | POA: Diagnosis not present

## 2019-01-27 DIAGNOSIS — E43 Unspecified severe protein-calorie malnutrition: Secondary | ICD-10-CM | POA: Diagnosis not present

## 2019-01-27 DIAGNOSIS — E860 Dehydration: Secondary | ICD-10-CM | POA: Diagnosis not present

## 2019-01-27 DIAGNOSIS — F0151 Vascular dementia with behavioral disturbance: Secondary | ICD-10-CM | POA: Diagnosis not present

## 2019-01-27 DIAGNOSIS — F333 Major depressive disorder, recurrent, severe with psychotic symptoms: Secondary | ICD-10-CM | POA: Diagnosis not present

## 2019-01-28 DIAGNOSIS — F0151 Vascular dementia with behavioral disturbance: Secondary | ICD-10-CM | POA: Diagnosis not present

## 2019-01-28 DIAGNOSIS — E860 Dehydration: Secondary | ICD-10-CM | POA: Diagnosis not present

## 2019-01-28 DIAGNOSIS — E43 Unspecified severe protein-calorie malnutrition: Secondary | ICD-10-CM | POA: Diagnosis not present

## 2019-01-28 DIAGNOSIS — F333 Major depressive disorder, recurrent, severe with psychotic symptoms: Secondary | ICD-10-CM | POA: Diagnosis not present

## 2019-01-29 DIAGNOSIS — E43 Unspecified severe protein-calorie malnutrition: Secondary | ICD-10-CM | POA: Diagnosis not present

## 2019-01-29 DIAGNOSIS — F0151 Vascular dementia with behavioral disturbance: Secondary | ICD-10-CM | POA: Diagnosis not present

## 2019-01-29 DIAGNOSIS — F333 Major depressive disorder, recurrent, severe with psychotic symptoms: Secondary | ICD-10-CM | POA: Diagnosis not present

## 2019-01-29 DIAGNOSIS — E860 Dehydration: Secondary | ICD-10-CM | POA: Diagnosis not present

## 2019-01-30 DIAGNOSIS — F0151 Vascular dementia with behavioral disturbance: Secondary | ICD-10-CM | POA: Diagnosis not present

## 2019-01-30 DIAGNOSIS — E43 Unspecified severe protein-calorie malnutrition: Secondary | ICD-10-CM | POA: Diagnosis not present

## 2019-01-30 DIAGNOSIS — F333 Major depressive disorder, recurrent, severe with psychotic symptoms: Secondary | ICD-10-CM | POA: Diagnosis not present

## 2019-01-30 DIAGNOSIS — E860 Dehydration: Secondary | ICD-10-CM | POA: Diagnosis not present

## 2019-01-31 DIAGNOSIS — F0151 Vascular dementia with behavioral disturbance: Secondary | ICD-10-CM | POA: Diagnosis not present

## 2019-01-31 DIAGNOSIS — E860 Dehydration: Secondary | ICD-10-CM | POA: Diagnosis not present

## 2019-01-31 DIAGNOSIS — F333 Major depressive disorder, recurrent, severe with psychotic symptoms: Secondary | ICD-10-CM | POA: Diagnosis not present

## 2019-01-31 DIAGNOSIS — E43 Unspecified severe protein-calorie malnutrition: Secondary | ICD-10-CM | POA: Diagnosis not present

## 2019-02-01 DIAGNOSIS — F0151 Vascular dementia with behavioral disturbance: Secondary | ICD-10-CM | POA: Diagnosis not present

## 2019-02-01 DIAGNOSIS — E860 Dehydration: Secondary | ICD-10-CM | POA: Diagnosis not present

## 2019-02-01 DIAGNOSIS — E43 Unspecified severe protein-calorie malnutrition: Secondary | ICD-10-CM | POA: Diagnosis not present

## 2019-02-01 DIAGNOSIS — F333 Major depressive disorder, recurrent, severe with psychotic symptoms: Secondary | ICD-10-CM | POA: Diagnosis not present

## 2019-02-02 DIAGNOSIS — E43 Unspecified severe protein-calorie malnutrition: Secondary | ICD-10-CM | POA: Diagnosis not present

## 2019-02-02 DIAGNOSIS — F0151 Vascular dementia with behavioral disturbance: Secondary | ICD-10-CM | POA: Diagnosis not present

## 2019-02-02 DIAGNOSIS — F333 Major depressive disorder, recurrent, severe with psychotic symptoms: Secondary | ICD-10-CM | POA: Diagnosis not present

## 2019-02-02 DIAGNOSIS — E860 Dehydration: Secondary | ICD-10-CM | POA: Diagnosis not present

## 2019-02-03 DIAGNOSIS — E43 Unspecified severe protein-calorie malnutrition: Secondary | ICD-10-CM | POA: Diagnosis not present

## 2019-02-03 DIAGNOSIS — F333 Major depressive disorder, recurrent, severe with psychotic symptoms: Secondary | ICD-10-CM | POA: Diagnosis not present

## 2019-02-03 DIAGNOSIS — E860 Dehydration: Secondary | ICD-10-CM | POA: Diagnosis not present

## 2019-02-03 DIAGNOSIS — F0151 Vascular dementia with behavioral disturbance: Secondary | ICD-10-CM | POA: Diagnosis not present

## 2019-02-04 DIAGNOSIS — F333 Major depressive disorder, recurrent, severe with psychotic symptoms: Secondary | ICD-10-CM | POA: Diagnosis not present

## 2019-02-04 DIAGNOSIS — E860 Dehydration: Secondary | ICD-10-CM | POA: Diagnosis not present

## 2019-02-04 DIAGNOSIS — F0151 Vascular dementia with behavioral disturbance: Secondary | ICD-10-CM | POA: Diagnosis not present

## 2019-02-04 DIAGNOSIS — E43 Unspecified severe protein-calorie malnutrition: Secondary | ICD-10-CM | POA: Diagnosis not present

## 2019-02-05 DIAGNOSIS — E43 Unspecified severe protein-calorie malnutrition: Secondary | ICD-10-CM | POA: Diagnosis not present

## 2019-02-05 DIAGNOSIS — F333 Major depressive disorder, recurrent, severe with psychotic symptoms: Secondary | ICD-10-CM | POA: Diagnosis not present

## 2019-02-05 DIAGNOSIS — F0151 Vascular dementia with behavioral disturbance: Secondary | ICD-10-CM | POA: Diagnosis not present

## 2019-02-05 DIAGNOSIS — E860 Dehydration: Secondary | ICD-10-CM | POA: Diagnosis not present

## 2019-02-06 DIAGNOSIS — E860 Dehydration: Secondary | ICD-10-CM | POA: Diagnosis not present

## 2019-02-06 DIAGNOSIS — E43 Unspecified severe protein-calorie malnutrition: Secondary | ICD-10-CM | POA: Diagnosis not present

## 2019-02-06 DIAGNOSIS — F0151 Vascular dementia with behavioral disturbance: Secondary | ICD-10-CM | POA: Diagnosis not present

## 2019-02-06 DIAGNOSIS — F333 Major depressive disorder, recurrent, severe with psychotic symptoms: Secondary | ICD-10-CM | POA: Diagnosis not present

## 2019-02-07 DIAGNOSIS — E43 Unspecified severe protein-calorie malnutrition: Secondary | ICD-10-CM | POA: Diagnosis not present

## 2019-02-07 DIAGNOSIS — F333 Major depressive disorder, recurrent, severe with psychotic symptoms: Secondary | ICD-10-CM | POA: Diagnosis not present

## 2019-02-07 DIAGNOSIS — E860 Dehydration: Secondary | ICD-10-CM | POA: Diagnosis not present

## 2019-02-07 DIAGNOSIS — F0151 Vascular dementia with behavioral disturbance: Secondary | ICD-10-CM | POA: Diagnosis not present

## 2019-02-08 DIAGNOSIS — E860 Dehydration: Secondary | ICD-10-CM | POA: Diagnosis not present

## 2019-02-08 DIAGNOSIS — F0151 Vascular dementia with behavioral disturbance: Secondary | ICD-10-CM | POA: Diagnosis not present

## 2019-02-08 DIAGNOSIS — F333 Major depressive disorder, recurrent, severe with psychotic symptoms: Secondary | ICD-10-CM | POA: Diagnosis not present

## 2019-02-08 DIAGNOSIS — E43 Unspecified severe protein-calorie malnutrition: Secondary | ICD-10-CM | POA: Diagnosis not present

## 2019-03-16 DEATH — deceased

## 2020-03-15 DEATH — deceased

## 2021-03-15 DEATH — deceased
